# Patient Record
Sex: Female | Born: 1955 | Race: White | Hispanic: No | Marital: Married | Smoking: Former smoker
Health system: Southern US, Community
[De-identification: ages and names within clinical notes are randomized; demographics above are authoritative.]

## PROBLEM LIST (undated history)

## (undated) DIAGNOSIS — E039 Hypothyroidism, unspecified: Secondary | ICD-10-CM

## (undated) DIAGNOSIS — E785 Hyperlipidemia, unspecified: Secondary | ICD-10-CM

## (undated) DIAGNOSIS — R9409 Abnormal results of other function studies of central nervous system: Secondary | ICD-10-CM

## (undated) DIAGNOSIS — I10 Essential (primary) hypertension: Secondary | ICD-10-CM

## (undated) HISTORY — DX: Essential (primary) hypertension: I10

## (undated) HISTORY — DX: Abnormal results of other function studies of central nervous system: R94.09

## (undated) HISTORY — DX: Hyperlipidemia, unspecified: E78.5

## (undated) HISTORY — DX: Hypothyroidism, unspecified: E03.9

---

## 2000-01-04 ENCOUNTER — Encounter: Admission: RE | Admit: 2000-01-04 | Discharge: 2000-01-04 | Payer: Self-pay | Admitting: Family Medicine

## 2000-01-04 ENCOUNTER — Encounter: Payer: Self-pay | Admitting: Family Medicine

## 2000-02-16 ENCOUNTER — Encounter: Payer: Self-pay | Admitting: Internal Medicine

## 2000-02-16 ENCOUNTER — Ambulatory Visit (HOSPITAL_COMMUNITY): Admission: RE | Admit: 2000-02-16 | Discharge: 2000-02-16 | Payer: Self-pay | Admitting: Internal Medicine

## 2000-04-01 ENCOUNTER — Observation Stay (HOSPITAL_COMMUNITY): Admission: EM | Admit: 2000-04-01 | Discharge: 2000-04-02 | Payer: Self-pay | Admitting: Emergency Medicine

## 2000-04-10 HISTORY — PX: THYROIDECTOMY: SHX17

## 2000-08-01 ENCOUNTER — Encounter: Payer: Self-pay | Admitting: Surgery

## 2000-08-06 ENCOUNTER — Encounter (INDEPENDENT_AMBULATORY_CARE_PROVIDER_SITE_OTHER): Payer: Self-pay

## 2000-08-06 ENCOUNTER — Observation Stay (HOSPITAL_COMMUNITY): Admission: RE | Admit: 2000-08-06 | Discharge: 2000-08-07 | Payer: Self-pay | Admitting: Surgery

## 2001-04-16 ENCOUNTER — Other Ambulatory Visit: Admission: RE | Admit: 2001-04-16 | Discharge: 2001-04-16 | Payer: Self-pay | Admitting: Gynecology

## 2001-08-04 ENCOUNTER — Emergency Department (HOSPITAL_COMMUNITY): Admission: EM | Admit: 2001-08-04 | Discharge: 2001-08-04 | Payer: Self-pay | Admitting: Emergency Medicine

## 2002-06-09 ENCOUNTER — Other Ambulatory Visit: Admission: RE | Admit: 2002-06-09 | Discharge: 2002-06-09 | Payer: Self-pay | Admitting: Gynecology

## 2003-07-13 ENCOUNTER — Other Ambulatory Visit: Admission: RE | Admit: 2003-07-13 | Discharge: 2003-07-13 | Payer: Self-pay | Admitting: Gynecology

## 2003-07-30 ENCOUNTER — Encounter: Admission: RE | Admit: 2003-07-30 | Discharge: 2003-07-30 | Payer: Self-pay | Admitting: Gynecology

## 2003-08-06 ENCOUNTER — Encounter: Admission: RE | Admit: 2003-08-06 | Discharge: 2003-08-06 | Payer: Self-pay | Admitting: Gynecology

## 2004-02-11 ENCOUNTER — Encounter: Admission: RE | Admit: 2004-02-11 | Discharge: 2004-02-11 | Payer: Self-pay | Admitting: Gynecology

## 2004-02-25 ENCOUNTER — Other Ambulatory Visit: Admission: RE | Admit: 2004-02-25 | Discharge: 2004-02-25 | Payer: Self-pay | Admitting: Gynecology

## 2004-04-01 ENCOUNTER — Ambulatory Visit (HOSPITAL_COMMUNITY): Admission: RE | Admit: 2004-04-01 | Discharge: 2004-04-01 | Payer: Self-pay | Admitting: Internal Medicine

## 2004-04-14 ENCOUNTER — Ambulatory Visit (HOSPITAL_BASED_OUTPATIENT_CLINIC_OR_DEPARTMENT_OTHER): Admission: RE | Admit: 2004-04-14 | Discharge: 2004-04-14 | Payer: Self-pay | Admitting: Internal Medicine

## 2004-04-14 ENCOUNTER — Ambulatory Visit: Payer: Self-pay | Admitting: Internal Medicine

## 2004-05-04 ENCOUNTER — Ambulatory Visit: Payer: Self-pay | Admitting: Internal Medicine

## 2004-10-18 ENCOUNTER — Encounter: Admission: RE | Admit: 2004-10-18 | Discharge: 2004-10-18 | Payer: Self-pay | Admitting: Gynecology

## 2004-10-19 ENCOUNTER — Other Ambulatory Visit: Admission: RE | Admit: 2004-10-19 | Discharge: 2004-10-19 | Payer: Self-pay | Admitting: Gynecology

## 2006-01-10 ENCOUNTER — Encounter: Admission: RE | Admit: 2006-01-10 | Discharge: 2006-01-10 | Payer: Self-pay | Admitting: Family Medicine

## 2006-01-30 ENCOUNTER — Other Ambulatory Visit: Admission: RE | Admit: 2006-01-30 | Discharge: 2006-01-30 | Payer: Self-pay | Admitting: Gynecology

## 2006-04-10 LAB — CONVERTED CEMR LAB: Pap Smear: NORMAL

## 2006-10-16 ENCOUNTER — Other Ambulatory Visit: Admission: RE | Admit: 2006-10-16 | Discharge: 2006-10-16 | Payer: Self-pay | Admitting: Gynecology

## 2008-04-01 ENCOUNTER — Encounter: Payer: Self-pay | Admitting: Family Medicine

## 2008-04-17 ENCOUNTER — Encounter: Payer: Self-pay | Admitting: Family Medicine

## 2008-04-17 ENCOUNTER — Encounter: Admission: RE | Admit: 2008-04-17 | Discharge: 2008-04-17 | Payer: Self-pay | Admitting: Family Medicine

## 2008-09-14 ENCOUNTER — Ambulatory Visit: Payer: Self-pay | Admitting: Family Medicine

## 2008-09-14 DIAGNOSIS — E039 Hypothyroidism, unspecified: Secondary | ICD-10-CM

## 2008-09-14 DIAGNOSIS — E785 Hyperlipidemia, unspecified: Secondary | ICD-10-CM | POA: Insufficient documentation

## 2008-09-14 HISTORY — DX: Hypothyroidism, unspecified: E03.9

## 2008-09-14 HISTORY — DX: Hyperlipidemia, unspecified: E78.5

## 2008-09-15 LAB — CONVERTED CEMR LAB
ALT: 12 units/L (ref 0–35)
Albumin: 3.8 g/dL (ref 3.5–5.2)
Basophils Absolute: 0 10*3/uL (ref 0.0–0.1)
Cholesterol: 234 mg/dL — ABNORMAL HIGH (ref 0–200)
Creatinine, Ser: 0.8 mg/dL (ref 0.4–1.2)
Direct LDL: 178 mg/dL
Eosinophils Relative: 2.6 % (ref 0.0–5.0)
HCT: 43.6 % (ref 36.0–46.0)
Hemoglobin: 15.2 g/dL — ABNORMAL HIGH (ref 12.0–15.0)
MCHC: 34.8 g/dL (ref 30.0–36.0)
MCV: 92 fL (ref 78.0–100.0)
Neutro Abs: 3.6 10*3/uL (ref 1.4–7.7)
Neutrophils Relative %: 59.5 % (ref 43.0–77.0)
Platelets: 163 10*3/uL (ref 150.0–400.0)
RBC: 4.74 M/uL (ref 3.87–5.11)
RDW: 13.1 % (ref 11.5–14.6)
TSH: 1.34 microintl units/mL (ref 0.35–5.50)
Total Bilirubin: 0.6 mg/dL (ref 0.3–1.2)
Total CHOL/HDL Ratio: 6
Total Protein: 7 g/dL (ref 6.0–8.3)
Triglycerides: 128 mg/dL (ref 0.0–149.0)
VLDL: 25.6 mg/dL (ref 0.0–40.0)

## 2008-10-30 ENCOUNTER — Telehealth: Payer: Self-pay | Admitting: Family Medicine

## 2008-11-13 ENCOUNTER — Ambulatory Visit: Payer: Self-pay | Admitting: Family Medicine

## 2008-11-13 LAB — CONVERTED CEMR LAB
Albumin: 3.8 g/dL (ref 3.5–5.2)
Alkaline Phosphatase: 86 units/L (ref 39–117)
Bilirubin, Direct: 0.1 mg/dL (ref 0.0–0.3)
HDL: 38.3 mg/dL — ABNORMAL LOW (ref >39.00)
Total CHOL/HDL Ratio: 4
Triglycerides: 72 mg/dL (ref 0.0–149.0)

## 2009-05-14 ENCOUNTER — Ambulatory Visit: Payer: Self-pay | Admitting: Family Medicine

## 2009-05-17 LAB — CONVERTED CEMR LAB
LDL Cholesterol: 78 mg/dL (ref 0–99)
TSH: 2.18 microintl units/mL (ref 0.35–5.50)
Total CHOL/HDL Ratio: 3
Triglycerides: 77 mg/dL (ref 0.0–149.0)

## 2009-07-05 ENCOUNTER — Ambulatory Visit: Payer: Self-pay | Admitting: Family Medicine

## 2009-07-05 DIAGNOSIS — R42 Dizziness and giddiness: Secondary | ICD-10-CM | POA: Insufficient documentation

## 2009-07-05 DIAGNOSIS — M545 Low back pain, unspecified: Secondary | ICD-10-CM | POA: Insufficient documentation

## 2009-07-05 LAB — CONVERTED CEMR LAB: Blood Glucose, Fingerstick: 101

## 2009-07-14 ENCOUNTER — Ambulatory Visit: Payer: Self-pay | Admitting: Family Medicine

## 2009-08-11 ENCOUNTER — Ambulatory Visit: Payer: Self-pay | Admitting: Family Medicine

## 2009-08-11 DIAGNOSIS — I10 Essential (primary) hypertension: Secondary | ICD-10-CM | POA: Insufficient documentation

## 2009-08-11 HISTORY — DX: Essential (primary) hypertension: I10

## 2009-08-11 LAB — CONVERTED CEMR LAB
BUN: 13 mg/dL (ref 6–23)
Creatinine, Ser: 0.8 mg/dL (ref 0.4–1.2)

## 2009-08-13 ENCOUNTER — Encounter: Admission: RE | Admit: 2009-08-13 | Discharge: 2009-08-13 | Payer: Self-pay | Admitting: Family Medicine

## 2009-08-16 DIAGNOSIS — R9409 Abnormal results of other function studies of central nervous system: Secondary | ICD-10-CM

## 2009-08-16 HISTORY — DX: Abnormal results of other function studies of central nervous system: R94.09

## 2009-08-27 ENCOUNTER — Telehealth: Payer: Self-pay | Admitting: Family Medicine

## 2009-08-27 ENCOUNTER — Ambulatory Visit: Payer: Self-pay | Admitting: Family Medicine

## 2009-08-30 ENCOUNTER — Ambulatory Visit: Payer: Self-pay | Admitting: Family Medicine

## 2009-09-15 ENCOUNTER — Telehealth: Payer: Self-pay | Admitting: Family Medicine

## 2009-09-17 ENCOUNTER — Telehealth: Payer: Self-pay | Admitting: Family Medicine

## 2009-10-13 ENCOUNTER — Telehealth (INDEPENDENT_AMBULATORY_CARE_PROVIDER_SITE_OTHER): Payer: Self-pay | Admitting: *Deleted

## 2009-10-27 ENCOUNTER — Telehealth (INDEPENDENT_AMBULATORY_CARE_PROVIDER_SITE_OTHER): Payer: Self-pay | Admitting: *Deleted

## 2009-10-29 ENCOUNTER — Telehealth: Payer: Self-pay | Admitting: Family Medicine

## 2009-11-16 ENCOUNTER — Encounter (INDEPENDENT_AMBULATORY_CARE_PROVIDER_SITE_OTHER): Payer: Self-pay | Admitting: Diagnostic Neuroimaging

## 2009-11-16 ENCOUNTER — Ambulatory Visit: Payer: Self-pay | Admitting: Cardiology

## 2009-11-16 ENCOUNTER — Ambulatory Visit (HOSPITAL_COMMUNITY)
Admission: RE | Admit: 2009-11-16 | Discharge: 2009-11-16 | Payer: Self-pay | Source: Home / Self Care | Admitting: Diagnostic Neuroimaging

## 2009-11-16 ENCOUNTER — Ambulatory Visit: Payer: Self-pay

## 2009-11-23 ENCOUNTER — Telehealth: Payer: Self-pay | Admitting: Cardiovascular Disease

## 2009-11-25 ENCOUNTER — Telehealth: Payer: Self-pay | Admitting: Family Medicine

## 2009-12-20 ENCOUNTER — Encounter: Payer: Self-pay | Admitting: Family Medicine

## 2009-12-21 ENCOUNTER — Telehealth: Payer: Self-pay | Admitting: Family Medicine

## 2009-12-29 ENCOUNTER — Encounter: Payer: Self-pay | Admitting: Family Medicine

## 2010-01-02 ENCOUNTER — Encounter: Payer: Self-pay | Admitting: Family Medicine

## 2010-01-20 ENCOUNTER — Ambulatory Visit: Payer: Self-pay | Admitting: Family Medicine

## 2010-01-20 DIAGNOSIS — J309 Allergic rhinitis, unspecified: Secondary | ICD-10-CM | POA: Insufficient documentation

## 2010-03-11 ENCOUNTER — Encounter: Payer: Self-pay | Admitting: Family Medicine

## 2010-03-28 ENCOUNTER — Ambulatory Visit
Admission: RE | Admit: 2010-03-28 | Discharge: 2010-03-28 | Payer: Self-pay | Source: Home / Self Care | Attending: Urology | Admitting: Urology

## 2010-03-30 ENCOUNTER — Encounter: Payer: Self-pay | Admitting: Family Medicine

## 2010-05-01 ENCOUNTER — Encounter: Payer: Self-pay | Admitting: Family Medicine

## 2010-05-10 NOTE — Assessment & Plan Note (Signed)
Summary: dizziness//ccc   Vital Signs:  Patient profile:   55 year old female Menstrual status:  postmenopausal Weight:      154 pounds BMI:     25.33 Temp:     98.5 degrees F oral Pulse rate:   56 / minute Pulse rhythm:   regular BP sitting:   118 / 66  (left arm) Cuff size:   regular  Vitals Entered By: Raechel Ache, RN (Aug 27, 2009 4:20 PM) CC: Referred to neurologist by Dr Loriana Kitchens, seeing June 1st. Sx getting worse- sweaty, dizzy, nauseous and nearly passing out x3 today.   History of Present Illness: Here to discuss spells of dizziness and near syncope that have been going on for several months. She has seen Dr. Caryl Never for this, and her workup has been notable for an abnormal brain MRI on 08-13-09. This showed widespread white matter lesions which are suggestive of a demyelinating process. the largest of these areas is locate din the left cerebellum. Her main symptoms are dizziness which make her feel like the room is moving around her and a feeling of lightheadedness like she might pass out. There has been no LOC however. She has some intermittent diplopia as well. No HA or changes in sensation or strength. She has stopped driving for now, and she has her son driving her around. She has an appt. for a neurology consult with Dr. Joycelyn Schmid on 09-08-09.   Allergies (verified): No Known Drug Allergies  Past History:  Past Medical History: Reviewed history from 07/05/2009 and no changes required. Arthritis Hypertension Hyperlipidemia Kidney stones Hypothyroid  Past Surgical History: Reviewed history from 09/14/2008 and no changes required. Thyroidectomy 2002  Review of Systems  The patient denies anorexia, fever, weight loss, weight gain, vision loss, decreased hearing, hoarseness, chest pain, syncope, dyspnea on exertion, peripheral edema, prolonged cough, headaches, hemoptysis, abdominal pain, melena, hematochezia, severe indigestion/heartburn, hematuria,  incontinence, genital sores, muscle weakness, suspicious skin lesions, transient blindness, difficulty walking, depression, unusual weight change, abnormal bleeding, enlarged lymph nodes, angioedema, breast masses, and testicular masses.    Physical Exam  General:  Well-developed,well-nourished,in no acute distress; alert,appropriate and cooperative throughout examination Head:  Normocephalic and atraumatic without obvious abnormalities. No apparent alopecia or balding. Eyes:  No corneal or conjunctival inflammation noted. EOMI. Perrla. Funduscopic exam benign, without hemorrhages, exudates or papilledema. Vision grossly normal. Neck:  No deformities, masses, or tenderness noted. Lungs:  Normal respiratory effort, chest expands symmetrically. Lungs are clear to auscultation, no crackles or wheezes. Heart:  Normal rate and regular rhythm. S1 and S2 normal without gallop, murmur, click, rub or other extra sounds. Neurologic:  alert & oriented X3, cranial nerves II-XII intact, and gait normal.     Impression & Recommendations:  Problem # 1:  DIZZINESS (ICD-780.4)  Her updated medication list for this problem includes:    Meclizine Hcl 25 Mg Tabs (Meclizine hcl) .Marland Kitchen... 1 q 4 hours as needed for dizziness  Problem # 2:  MRI, BRAIN, ABNORMAL (ICD-794.09)  Complete Medication List: 1)  Levothroid 125 Mcg Tabs (Levothyroxine sodium) .... Once daily 2)  Crestor 10 Mg Tabs (Rosuvastatin calcium) .... Once daily 3)  Benicar Hct 20-12.5 Mg Tabs (Olmesartan medoxomil-hctz) .... One by mouth once daily 4)  Meclizine Hcl 25 Mg Tabs (Meclizine hcl) .Marland Kitchen.. 1 q 4 hours as needed for dizziness  Patient Instructions: 1)  This picture is certainly worrisome for a demyelinating process, and it is no coincidence that her main symptoms involve dysequilibrium given  that the largest  lesion seen on her MRI is in the cerebellum. try Meclizine to help with the symptoms. rest, stay out of the heat, see Dr. Caryl Never  next week.  Prescriptions: MECLIZINE HCL 25 MG TABS (MECLIZINE HCL) 1 q 4 hours as needed for dizziness  #60 x 2   Entered and Authorized by:   Nelwyn Salisbury MD   Signed by:   Nelwyn Salisbury MD on 08/27/2009   Method used:   Electronically to        [facility] [address] [phone]* (retail)       [address] [place], [place]  [address]       Ph: [phone]       Fax: [phone]   RxID:   [id number]

## 2010-05-10 NOTE — Progress Notes (Signed)
Summary: rx losartan  Phone Note Call from Patient   Caller: Patient Call For: Evelena Peat MD Summary of Call: called wed r/t change for benicar - too exspensvie.   In looking into chart - Dr. Caryl Never did handle 7/20 - must not of been called in . Will handle now. KIK Initial call taken by: Duard Brady LPN,  October 29, 2009 1:37 PM    New/Updated Medications: LOSARTAN POTASSIUM 50 MG TABS (LOSARTAN POTASSIUM) qd Prescriptions: LOSARTAN POTASSIUM 50 MG TABS (LOSARTAN POTASSIUM) qd  #30 x 5   Entered by:   Duard Brady LPN   Authorized by:   Evelena Peat MD   Signed by:   Duard Brady LPN on 16/01/9603   Method used:   Faxed to ...       [facility] [address] (retail)       [address] [place], [place]  [address]       Ph: [phone]       Fax: [phone]   RxID:   [id number]

## 2010-05-10 NOTE — Progress Notes (Signed)
Summary: Pt req referral to either [facility] or [facility]  Phone Note Call from Patient Call back at [phone] cell   Caller: Patient Summary of Call: Pt called and was referred to [facility], and pt said that she had a bad experience there. Pt is req to be referred over to [place], [place] or [place]. Pls call.     Initial call taken by: Lucy Antigua,  November 25, 2009 9:37 AM  Follow-up for Phone Call        I'm OK to refer to any place of her preference.  Other option is [facility]-I've gotten good feedback from pts who have been seen there.  Is she OK with considering that? Follow-up by: Evelena Peat MD,  November 25, 2009 1:08 PM  Additional Follow-up for Phone Call Additional follow up Details #1::        I called pt and told her about [facility]. Pt said that [facility] would be fine with her, as long as Dr. Mar Daring feels that is a good place to go. Pt says that when this appt is scheduled, it problably need to be for mid morning, between 9:30am and 12:00pm.     Additional Follow-up by: Lucy Antigua,  November 26, 2009 9:16 AM    Additional Follow-up for Phone Call Additional follow up Details #2::    will set up appt with [facility]. Follow-up by: Evelena Peat MD,  November 26, 2009 1:11 PM

## 2010-05-10 NOTE — Assessment & Plan Note (Signed)
Summary: 1 month rov/njr   Vital Signs:  Patient profile:   55 year old female Menstrual status:  postmenopausal Temp:     97.9 degrees F oral BP sitting:   140 / 84  (left arm) Cuff size:   regular  Vitals Entered By: [name] LPN (Aug 11, 452 8:19 AM)  Serial Vital Signs/Assessments:  Time      Position  BP       Pulse  Resp  Temp     By                     162/82                         Evelena Peat MD  CC: one month follow-up, Hypertension Management   History of Present Illness: Follow up hypertension.  Started Benicar and tolerating well.  BP still high by home readings 150s to 160 systolic and diastolics 80s.  Recent issues of dizziness, decreased appetite, nausea with occ vomiting, and binocular diplopia.  had these episodes occur somewhat infrequently and symptoms seem to be very episodic. She has noted that she seems to have more frequent symptoms of dizziness and diplopia with driving. Denies any true blurred vision or loss of vision. She's also having some unilateral headaches which are new in the right parietal occipital region. These too are somewhat intermittent. Her other symptoms of diplopia ,nausea ,and dizziness seemed to be correlating with headaches off and on. She denies any focal weakness.  Dizziness sounds to be more of a vertigo at times. No orthostatic type symptoms.  patient has hypothyroidism with normal TSH in February of this year.  Hypertension History:      She denies chest pain, palpitations, dyspnea with exertion, orthopnea, PND, peripheral edema, syncope, and side effects from treatment.        Positive major cardiovascular risk factors include hyperlipidemia and hypertension.  Negative major cardiovascular risk factors include female age less than 70 years old and non-tobacco-user status.     Allergies (verified): No Known Drug Allergies  Past History:  Past Medical History: Last updated:  07/05/2009 Arthritis Hypertension Hyperlipidemia Kidney stones Hypothyroid  Past Surgical History: Last updated: 09/14/2008 Thyroidectomy 2002  Social History: Last updated: 07/05/2009 Occupation: Married Former Smoker  Review of Systems       The patient complains of anorexia.  The patient denies fever, weight loss, vision loss, decreased hearing, chest pain, syncope, dyspnea on exertion, peripheral edema, prolonged cough, hemoptysis, abdominal pain, melena, hematochezia, severe indigestion/heartburn, muscle weakness, transient blindness, and difficulty walking.    Physical Exam  General:  Well-developed,well-nourished,in no acute distress; alert,appropriate and cooperative throughout examination Head:  Normocephalic and atraumatic without obvious abnormalities. No apparent alopecia or balding. Eyes:  No corneal or conjunctival inflammation noted. EOMI. Perrla. Funduscopic exam benign, without hemorrhages, exudates or papilledema. Vision grossly normal. Ears:  External ear exam shows no significant lesions or deformities.  Otoscopic examination reveals clear canals, tympanic membranes are intact bilaterally without bulging, retraction, inflammation or discharge. Hearing is grossly normal bilaterally. Mouth:  Oral mucosa and oropharynx without lesions or exudates.  Teeth in good repair. Neck:  No deformities, masses, or tenderness noted. Lungs:  Normal respiratory effort, chest expands symmetrically. Lungs are clear to auscultation, no crackles or wheezes. Heart:  Normal rate and regular rhythm. S1 and S2 normal without gallop, murmur, click, rub or other extra sounds. Neurologic:  alert & oriented X3,  cranial nerves II-XII intact, strength normal in all extremities, gait normal, DTRs symmetrical and normal, and finger-to-nose normal.     Impression & Recommendations:  Problem # 1:  ESSENTIAL HYPERTENSION (ICD-401.9) slightly improved but not to goal. Change medication to Benicar  HCTZ and reassess one month Her updated medication list for this problem includes:    Benicar Hct 20-12.5 Mg Tabs (Olmesartan medoxomil-hctz) ..... One by mouth once daily  Problem # 2:  DIZZINESS (ICD-780.4)  patient had some progressive symptoms of dizziness and symptom complex including decreased appetite, intermittent atypical headaches and some binocular diplopia. She needs MRI scan of brain to further assess and we'll set up  Orders: Radiology Referral (Radiology)  Complete Medication List: 1)  Levothroid 125 Mcg Tabs (Levothyroxine sodium) .... Once daily 2)  Crestor 10 Mg Tabs (Rosuvastatin calcium) .... Once daily 3)  Benicar Hct 20-12.5 Mg Tabs (Olmesartan medoxomil-hctz) .... One by mouth once daily  Hypertension Assessment/Plan:      The patient's hypertensive risk group is category B: At least one risk factor (excluding diabetes) with no target organ damage.  Her calculated 10 year risk of coronary heart disease is 8 %.  Today's blood pressure is 140/84.    Patient Instructions: 1)  Please schedule a follow-up appointment in 1 month.  2)  we'll call you regarding MRI appointment

## 2010-05-10 NOTE — Progress Notes (Signed)
Summary: has ? for dr Patrisha Hausmann  Phone Note Call from Patient Call back at [phone]   Caller: Patient---live call Summary of Call: Going through some tests and has ? about med. Wants Dr Caryl Never to return call. Initial call taken by: Warnell Forester,  September 15, 2009 2:43 PM  Follow-up for Phone Call        Seeing neurologist for work up abnormal MRI.  Dx  still in question. Symptoms have improved since stopping HCTZ.   Tolerating plain Benicar.  She is encouraged to continue with her neurologic w/u. Follow-up by: Evelena Peat MD,  September 15, 2009 6:23 PM

## 2010-05-10 NOTE — Progress Notes (Signed)
Summary: refill plain benicar  Phone Note Call from Patient Call back at [phone]   Caller: vm Summary of Call: Benicar 20mg  samples almost gone.  Says the HCT makes her have dizzy spells & near fainting.  Needs the plain Benicar.   Rx needed to [facility] [address] Initial call taken by: Rudy Jew, RN,  October 13, 2009 4:33 PM  Follow-up for Phone Call        refill Benicar 20 mg one half tablet by mouth once daily #30 with 5 refills. I have sent.  Let patient know OK to pick up. Follow-up by: Evelena Peat MD,  October 13, 2009 5:42 PM    New/Updated Medications: BENICAR 20 MG TABS (OLMESARTAN MEDOXOMIL) one half by mouth once daily Prescriptions: BENICAR 20 MG TABS (OLMESARTAN MEDOXOMIL) one half by mouth once daily  #30 x 5   Entered and Authorized by:   Evelena Peat MD   Signed by:   Evelena Peat MD on 10/13/2009   Method used:   Electronically to        [facility] [address] [phone]* (retail)       [address] [place], [place]  [address]       Ph: [phone]       Fax: [phone]   RxID:   [id number]

## 2010-05-10 NOTE — Consult Note (Signed)
Summary: [facility] @ [facility]   Imported By: Maryln Gottron 12/28/2009 13:56:33  _____________________________________________________________________  External Attachment:    Type:   Image     Comment:   External Document

## 2010-05-10 NOTE — Assessment & Plan Note (Signed)
Summary: BP RECHECK / F/U //RS   Vital Signs:  Patient profile:   55 year old female Menstrual status:  postmenopausal Temp:     97.8 degrees F oral BP sitting:   160 / 88  (left arm) Cuff size:   regular  Vitals Entered By: [name] LPN (July 14, 4780 8:33 AM) CC: BP check   History of Present Illness: Followup hypertension.  By home readings she's had consistent elevations between 160s to 190 systolic. No significant headaches since last visit. No further episodes of diplopia. Previously treated with some sort of blood pressure medication years ago but took briefly and apparently blood pressures improved and she was subsequently taken off. Denies any chest pains.  Allergies: No Known Drug Allergies  Past History:  Past Medical History: Last updated: 07/05/2009 Arthritis Hypertension Hyperlipidemia Kidney stones Hypothyroid PMH reviewed for relevance  Review of Systems  The patient denies anorexia, weight loss, vision loss, chest pain, syncope, dyspnea on exertion, and peripheral edema.    Physical Exam  General:  Well-developed,well-nourished,in no acute distress; alert,appropriate and cooperative throughout examination Neck:  scar from previous thyroidectomy. No mass Lungs:  Normal respiratory effort, chest expands symmetrically. Lungs are clear to auscultation, no crackles or wheezes. Heart:  Normal rate and regular rhythm. S1 and S2 normal without gallop, murmur, click, rub or other extra sounds.   Impression & Recommendations:  Problem # 1:  INCREASED BLOOD PRESSURE (ICD-796.2)  At this point we elected to treat. Start Benicar 20 mg daily and reassess one month- samples given  Her updated medication list for this problem includes:    Benicar 20 Mg Tabs (Olmesartan medoxomil) ..... One by mouth once daily  Complete Medication List: 1)  Levothroid 125 Mcg Tabs (Levothyroxine sodium) .... Once daily 2)  Crestor 10 Mg Tabs (Rosuvastatin calcium) ....  Once daily 3)  Benicar 20 Mg Tabs (Olmesartan medoxomil) .... One by mouth once daily  Patient Instructions: 1)  Please schedule a follow-up appointment in 1 month.  2)  It is important that you exercise reguarly at least 20 minutes 5 times a week. If you develop chest pain, have severe difficulty breathing, or feel very tired, stop exercising immediately and seek medical attention.  Prescriptions: CRESTOR 10 MG TABS (ROSUVASTATIN CALCIUM) once daily  #30.0 Each x 11   Entered and Authorized by:   Evelena Peat MD   Signed by:   Evelena Peat MD on 07/14/2009   Method used:   Electronically to        [facility] [address] [phone]* (retail)       [address] [place], [place]  [address]       Ph: [phone]       Fax: [phone]   RxID:   [id number]

## 2010-05-10 NOTE — Assessment & Plan Note (Signed)
Summary: Consultation re: neurology referral/nn   Vital Signs:  Patient profile:   55 year old female Menstrual status:  postmenopausal Temp:     98.2 degrees F oral BP sitting:   112 / 72  (left arm) Cuff size:   regular  Vitals Entered By: Duard Brady LPN (January 20, 2010 11:15 AM) CC: f/u neurology consultation , alsoc c/o head and chest congestion OTC not helping Is Patient Diabetic? No   History of Present Illness: Patient seen for the following.  Patient presented with neurologic symptoms including diplopia, headache, and dizziness last spring and we sent for MRI scan. This came back with nonspecific abnormalities. She was sent to two diffenrent neurology groups and has been unhappy with care in both. More recently had difficult lumbar puncture. Reportedly this end up being okay. She had several labs including 24-hour urine reportedly to evaluate mildly elevated serum protein. She has not heard back from lab work and this was several weeks ago. She requested those labs be forwarded here. At this point she generally feels well no headache, dizziness or focal neurologic symptoms. Her diplopia has fully cleared.  Part of her frustration is that she has been told multiple things (per pt ) from different neurologists regarding possible etioilogy.  One stated probable MS and another was very concerned about possible stroke.  She has acute issue today of 2-3  day history nasal congestion, frequent sneezing, clear nasal discharge and occasional cough. No fever. Has not taken any antihistamines.  Allergies (verified): 1)  ! * Benicar  Past History:  Past Medical History: Last updated: 07/05/2009 Arthritis Hypertension Hyperlipidemia Kidney stones Hypothyroid  Past Surgical History: Last updated: 09/14/2008 Thyroidectomy 2002  Family History: Last updated: 09/14/2008 Family History of Arthritis Family history heart disease due to malaria, parent family history  prostate cancer, grandfather family history kidney disease, blood relative family historu diabetes, blood relative  Social History: Last updated: 07/05/2009 Occupation: Married Former Smoker  Risk Factors: Smoking Status: quit (07/05/2009) PMH-FH-SH reviewed for relevance  Review of Systems  The patient denies anorexia, fever, weight loss, vision loss, decreased hearing, chest pain, dyspnea on exertion, headaches, abdominal pain, incontinence, muscle weakness, and suspicious skin lesions.    Physical Exam  General:  Well-developed,well-nourished,in no acute distress; alert,appropriate and cooperative throughout examination Head:  Normocephalic and atraumatic without obvious abnormalities. No apparent alopecia or balding. Ears:  External ear exam shows no significant lesions or deformities.  Otoscopic examination reveals clear canals, tympanic membranes are intact bilaterally without bulging, retraction, inflammation or discharge. Hearing is grossly normal bilaterally. Nose:  External nasal examination shows no deformity or inflammation. Nasal mucosa are pink and moist without lesions or exudates. Mouth:  Oral mucosa and oropharynx without lesions or exudates.  Teeth in good repair. Neck:  No deformities, masses, or tenderness noted. Lungs:  Normal respiratory effort, chest expands symmetrically. Lungs are clear to auscultation, no crackles or wheezes. Heart:  Normal rate and regular rhythm. S1 and S2 normal without gallop, murmur, click, rub or other extra sounds. Neurologic:  alert & oriented X3, cranial nerves II-XII intact, and gait normal.     Impression & Recommendations:  Problem # 1:  MRI, BRAIN, ABNORMAL (ICD-794.09) She has had extensive work up.  She has requested we receive and review her recent labs and I have not received yet.  Problem # 2:  ALLERGIC RHINITIS (ICD-477.9) Assessment: New try OTC antihistamine.  Problem # 3:  Preventive Health Care (ICD-V70.0) flu  vaccine given.  Complete  Medication List: 1)  Levothroid 125 Mcg Tabs (Levothyroxine sodium) .... Once daily 2)  Crestor 10 Mg Tabs (Rosuvastatin calcium) .... Once daily 3)  Losartan Potassium 50 Mg Tabs (Losartan potassium) .... Qd  Other Orders: Admin 1st Vaccine (04540) Flu Vaccine 77yrs + (98119)  Patient Instructions: 1)  We do not have copy of recent labs from Neurologist yet.  Will review when available. 2)  Try Allegra 180 mg one daily.   Flu Vaccine Consent Questions     Do you have a history of severe allergic reactions to this vaccine? no    Any prior history of allergic reactions to egg and/or gelatin? no    Do you have a sensitivity to the preservative Thimersol? no    Do you have a past history of Guillan-Barre Syndrome? no    Do you currently have an acute febrile illness? no    Have you ever had a severe reaction to latex? no    Vaccine information given and explained to patient? yes    Are you currently pregnant? no    Lot Number:[id number]   Exp Date:10/08/2010   Site Given  Left Deltoid IMbflu

## 2010-05-10 NOTE — Letter (Signed)
Summary: [facility]  [facility]   Imported By: Maryln Gottron 01/26/2010 11:08:03  _____________________________________________________________________  External Attachment:    Type:   Image     Comment:   External Document

## 2010-05-10 NOTE — Letter (Signed)
Summary: [facility]  [facility]   Imported By: Maryln Gottron 03/17/2010 12:38:57  _____________________________________________________________________  External Attachment:    Type:   Image     Comment:   External Document

## 2010-05-10 NOTE — Progress Notes (Signed)
Summary: FYI  Phone Note Call from Patient   Caller: Patient Call For: Evelena Peat MD Summary of Call: Pt is calling to report that she is having "near syncope" episodes, and BP is 100/60.  Offered work in appt today or appt in Saturday clinic, but she declined.  Appt scheduled for Monday, and instructed pt to call back or go to the ER if symptoms change or get worse.  No chest pain, bleeding or obvious reason for syncope from her complaints.  She informed me that she is being evaluated for MS, and has multiple neurological symptoms. Initial call taken by: Lynann Beaver CMA,  Aug 27, 2009 1:40 PM  Follow-up for Phone Call        she was seen today Follow-up by: Nelwyn Salisbury MD,  Aug 27, 2009 5:10 PM

## 2010-05-10 NOTE — Progress Notes (Signed)
Summary: different med for BP  Phone Note Call from Patient Call back at [phone]   Caller: vm Summary of Call: Copay med for BP $150 for 30 day supply. Did not get it.  Need different med.  Pharmacist said there are plenty of others that don't cost that much.   Initial call taken by: Rudy Jew, RN,  October 27, 2009 4:52 PM  Follow-up for Phone Call        change to Losartan 50 mg one by mouth once daily   disp #30 with 5 refills. Follow-up by: Evelena Peat MD,  October 27, 2009 5:49 PM  Additional Follow-up for Phone Call Additional follow up Details #1::        handled 10/29/09   kik Additional Follow-up by: Duard Brady LPN,  October 29, 2009 3:10 PM

## 2010-05-10 NOTE — Op Note (Signed)
Summary: Lumbar Puncture/[facility]  Lumbar Puncture/[facility]   Imported By: Maryln Gottron 01/26/2010 11:06:51  _____________________________________________________________________  External Attachment:    Type:   Image     Comment:   External Document

## 2010-05-10 NOTE — Assessment & Plan Note (Signed)
Summary: concerned about BP/dm   Vital Signs:  Patient profile:   55 year old female Menstrual status:  postmenopausal Temp:     98.2 degrees F oral BP sitting:   120 / 72  (left arm) BP standing:   98 / 60 Cuff size:   regular  Vitals Entered By: [name] LPN (Aug 30, 2009 9:57 AM)  Serial Vital Signs/Assessments:  Time      Position  BP       Pulse  Resp  Temp     By           Lying LA  102/60                         Evelena Peat MD           Standing  98/60                          Evelena Peat MD  CC: Ongoing dizzines, nausea   History of Present Illness: Patient seen for followup regarding ongoing dizziness and intermittent nausea. Refer to previous note. Recent MRI scan revealed question of demyelinating process in the cerebellum. Patient continues to have intermittent symptoms of diplopia and dizziness. Blood pressures on the low side past few days on Benicar-HCTZ. She additionally has some occasional orthostatic type symptoms now. Poor appetite over the past week. Otherwise no focal neurologic symptoms. Currently on Benicar HCTZ 20/12.5  She thinks the meclizine prescribed last week has worsened her dizziness, though has helped the nausea somewhat. Appointment with neurologist in one week  Allergies (verified): No Known Drug Allergies  Past History:  Past Medical History: Last updated: 07/05/2009 Arthritis Hypertension Hyperlipidemia Kidney stones Hypothyroid  Past Surgical History: Last updated: 09/14/2008 Thyroidectomy 2002  Family History: Last updated: 09/14/2008 Family History of Arthritis Family history heart disease due to malaria, parent family history prostate cancer, grandfather family history kidney disease, blood relative family historu diabetes, blood relative  Social History: Last updated: 07/05/2009 Occupation: Married Former Smoker  Risk Factors: Smoking Status: quit (07/05/2009)  Review of Systems       The patient  complains of anorexia.  The patient denies fever, vision loss, decreased hearing, chest pain, syncope, dyspnea on exertion, peripheral edema, incontinence, muscle weakness, transient blindness, and difficulty walking.    Physical Exam  General:  Well-developed,well-nourished,in no acute distress; alert,appropriate and cooperative throughout examination Eyes:  pupils equal, pupils round, and pupils reactive to light.   Ears:  External ear exam shows no significant lesions or deformities.  Otoscopic examination reveals clear canals, tympanic membranes are intact bilaterally without bulging, retraction, inflammation or discharge. Hearing is grossly normal bilaterally. Mouth:  Oral mucosa and oropharynx without lesions or exudates.  Teeth in good repair. Neck:  No deformities, masses, or tenderness noted. Lungs:  Normal respiratory effort, chest expands symmetrically. Lungs are clear to auscultation, no crackles or wheezes. Heart:  normal rate, regular rhythm, and no murmur.   Neurologic:  alert & oriented X3, cranial nerves II-XII intact, strength normal in all extremities, gait normal, and finger-to-nose normal.   Psych:  normally interactive, good eye contact, and slightly anxious.     Impression & Recommendations:  Problem # 1:  MRI, BRAIN, ABNORMAL (ICD-794.09) discussed findings with husband and wife.  Less likely CVA with hx of intermittent symptoms which would make demyelinating process seem more likely.  Family requesting if we can move up appt  with neurology and we are trying to do so.  Problem # 2:  ESSENTIAL HYPERTENSION (ICD-401.9) reduce blood pressure medication and hold alltogether for systolic < 100.  Go to one-half Benicar 20. Her updated medication list for this problem includes:    Benicar Hct 20-12.5 Mg Tabs (Olmesartan medoxomil-hctz) ..... One by mouth once daily  Complete Medication List: 1)  Levothroid 125 Mcg Tabs (Levothyroxine sodium) .... Once daily 2)  Crestor 10  Mg Tabs (Rosuvastatin calcium) .... Once daily 3)  Benicar Hct 20-12.5 Mg Tabs (Olmesartan medoxomil-hctz) .... One by mouth once daily 4)  Meclizine Hcl 25 Mg Tabs (Meclizine hcl) .Marland Kitchen.. 1 q 4 hours as needed for dizziness  Patient Instructions: 1)  Start baby aspirin 81 mg daily 2)  Hold meclizine 3)  Stop Benicar HCTZ and start back Benicar 20 mg one half tablet daily.

## 2010-05-10 NOTE — Assessment & Plan Note (Signed)
Summary: MED CK / CONCERNS W/ PRESENT MEDS // RS   Vital Signs:  Patient profile:   55 year old female Menstrual status:  postmenopausal Weight:      160 pounds Temp:     98.0 degrees F oral BP sitting:   160 / 82  (left arm) Cuff size:   regular  Vitals Entered By: [name] LPN (July 05, 2009 11:24 AM)   Serial Vital Signs/Assessments:  Time      Position  BP       Pulse  Resp  Temp     By                     174/84                         Evelena Peat MD           R Arm     180/84                         Evelena Peat MD           L Arm     174/84                         Evelena Peat MD  CC: med check, concerns with meds CBG Result 101   History of Present Illness: Patient has history of hypothyroidism and hyperlipidemia. She presents today with complaints of not feeling well for the past several days. Symptoms are somewhat vague but she's had some intermittent episodes of dizziness. She recalls first episode when she was cleaning out her garage and stood up suddenly and felt very lightheaded. No syncope. She describes some sensation of imbalance, ?vertigo. Question of binocular diplopia which was very transient couple of occasions. No headaches. No focal weakness.  Recent low back pain and patient considered whether Crestor could be causing. Symptoms resolved after stopping Crestor. No other myalgias. Recent lipids revealed good control. Hypothyroidism treated with Levothroid 125 micrograms daily. Patient compliant with that medication. Recent TSH normal.  Patient denies headaches, appetite change, weight change, hearing changes, chest pain, abdominal pain, focal weakness, or any paresthesias  Preventive Screening-Counseling & Management  Alcohol-Tobacco     Smoking Status: quit  Allergies (verified): No Known Drug Allergies  Past History:  Past Surgical History: Last updated: 09/14/2008 Thyroidectomy 2002  Family History: Last updated:  09/14/2008 Family History of Arthritis Family history heart disease due to malaria, parent family history prostate cancer, grandfather family history kidney disease, blood relative family historu diabetes, blood relative  Social History: Last updated: 07/05/2009 Occupation: Married Former Smoker  Past Medical History: Arthritis Hypertension Hyperlipidemia Kidney stones Hypothyroid PMH-FH-SH reviewed for relevance  Social History: Occupation: Married Former Smoker Smoking Status:  quit  Review of Systems      See HPI  Physical Exam  General:  Well-developed,well-nourished,in no acute distress; alert,appropriate and cooperative throughout examination Head:  Normocephalic and atraumatic without obvious abnormalities. No apparent alopecia or balding. Eyes:  pupils equal, pupils round, pupils reactive to light, no injection, and no retinal abnormalitiies.  EOMI Ears:  External ear exam shows no significant lesions or deformities.  Otoscopic examination reveals clear canals, tympanic membranes are intact bilaterally without bulging, retraction, inflammation or discharge. Hearing is grossly normal bilaterally. Mouth:  Oral mucosa and oropharynx without lesions or exudates.  Teeth in good repair. Neck:  No deformities, masses, or tenderness noted. Lungs:  Normal respiratory effort, chest expands symmetrically. Lungs are clear to auscultation, no crackles or wheezes. Heart:  normal rate and regular rhythm.   Extremities:  no edema. Neurologic:  alert & oriented X3, cranial nerves II-XII intact, strength normal in all extremities, gait normal, DTRs symmetrical and normal, finger-to-nose normal, and Romberg negative.   Psych:  normally interactive, good eye contact, not anxious appearing, and not depressed appearing.     Impression & Recommendations:  Problem # 1:  HYPERLIPIDEMIA (ICD-272.4)  Her updated medication list for this problem includes:    Crestor 10 Mg Tabs  (Rosuvastatin calcium) ..... Once daily  Problem # 2:  HYPOTHYROIDISM (ICD-244.9) TSH stable Her updated medication list for this problem includes:    Levothroid 125 Mcg Tabs (Levothyroxine sodium) ..... Once daily  Problem # 3:  DIZZINESS (ICD-780.4) ?etiology. ?vertiginous.  Neuro nonfocal but will need further assessment at f/u if symptoms persist, esp if further episodes of binocular diplopia.  CBG stable.  Problem # 4:  INCREASED BLOOD PRESSURE (ICD-796.2) Assessment: New No prior hx of signif BP elevation.  Bring back to reassess within one week and she will monitor regularly at home.  Problem # 5:  BACK PAIN, LUMBAR (ICD-724.2) Assessment: Improved pt thinks related to Crestor but doubtful statin related.  Complete Medication List: 1)  Levothroid 125 Mcg Tabs (Levothyroxine sodium) .... Once daily 2)  Crestor 10 Mg Tabs (Rosuvastatin calcium) .... Once daily  Other Orders: Capillary Blood Glucose/CBG (47829) Capillary Blood Glucose/CBG (56213)  Patient Instructions: 1)  Follow up in one week for recheck. 2)  Check your  Blood Pressure regularly .  Bring blood pressure readings at follow up.

## 2010-05-10 NOTE — Progress Notes (Signed)
Summary: talk to nurse  Phone Note Call from Patient Call back at Home Phone [phone] Call back at [phone]   Caller: Patient Reason for Call: Talk to Nurse Summary of Call: pt would like to talk to nurse or Dr Eden Emms about her echo.  Initial call taken by: Edman Circle,  November 23, 2009 1:51 PM  Follow-up for Phone Call        spoke with pt, questions answered Deliah Goody, RN  November 23, 2009 2:02 PM

## 2010-05-10 NOTE — Progress Notes (Signed)
Summary: FYI  Phone Note Call from Patient   Caller: Patient Call For: Evelena Peat MD Summary of Call: Pt wants Dr. Caryl Never to know she spoke to the pharmacist about the reaction she had, and was told this is a very common side effect when changing from plain Benicar to Benicar plus HCTZ. Initial call taken by: Lynann Beaver CMA,  September 17, 2009 1:22 PM  Follow-up for Phone Call        noted. Follow-up by: Evelena Peat MD,  September 17, 2009 1:28 PM

## 2010-05-10 NOTE — Progress Notes (Signed)
Summary: hypotension?  Phone Note Call from Patient   Caller: Patient Call For: Evelena Peat MD Summary of Call: Pt is concerned about BP being too low.  Reading are 95/56 and 100/59.  Questioning whether she should change any of her meds?? [phone] [facility]) Initial call taken by: Lynann Beaver CMA,  December 21, 2009 10:26 AM  Follow-up for Phone Call        not unless she is having any dizziness.  If she has orthostatic type of dizziness, reduce Losartan to one half tablet.  Losartan does'nt come in lower dose. Follow-up by: Evelena Peat MD,  December 21, 2009 12:34 PM  Additional Follow-up for Phone Call Additional follow up Details #1::        Notified pt. Additional Follow-up by: Lynann Beaver CMA,  December 21, 2009 12:36 PM

## 2010-05-12 NOTE — Letter (Signed)
Summary: [facility]  [facility]   Imported By: Maryln Gottron 04/07/2010 15:57:55  _____________________________________________________________________  External Attachment:    Type:   Image     Comment:   External Document

## 2010-06-14 ENCOUNTER — Other Ambulatory Visit: Payer: Self-pay | Admitting: Family Medicine

## 2010-06-16 ENCOUNTER — Encounter: Payer: Self-pay | Admitting: Family Medicine

## 2010-06-17 ENCOUNTER — Encounter: Payer: Self-pay | Admitting: Family Medicine

## 2010-06-17 ENCOUNTER — Ambulatory Visit (INDEPENDENT_AMBULATORY_CARE_PROVIDER_SITE_OTHER): Payer: BC Managed Care – PPO | Admitting: Family Medicine

## 2010-06-17 DIAGNOSIS — E785 Hyperlipidemia, unspecified: Secondary | ICD-10-CM

## 2010-06-17 DIAGNOSIS — E039 Hypothyroidism, unspecified: Secondary | ICD-10-CM

## 2010-06-17 DIAGNOSIS — I10 Essential (primary) hypertension: Secondary | ICD-10-CM

## 2010-06-17 LAB — HEPATIC FUNCTION PANEL
ALT: 12 U/L (ref 0–35)
Albumin: 4 g/dL (ref 3.5–5.2)
Bilirubin, Direct: 0.1 mg/dL (ref 0.0–0.3)
Total Protein: 6.7 g/dL (ref 6.0–8.3)

## 2010-06-17 LAB — LIPID PANEL: Cholesterol: 165 mg/dL (ref 0–200)

## 2010-06-17 LAB — TSH: TSH: 2.17 u[IU]/mL (ref 0.35–5.50)

## 2010-06-17 NOTE — Progress Notes (Signed)
Quick Note:  Pt informed ______ 

## 2010-06-17 NOTE — Patient Instructions (Signed)
Consider LacHydrin as skin moisturizer.

## 2010-06-17 NOTE — Progress Notes (Signed)
  Subjective:    Patient ID: Haley Montgomery, female    DOB: 07-13-1955, 55 y.o.   MRN: [id number]  HPI  Followup multiple medical problems. The patient has hypothyroidism and needs followup labs. Hypertension treated with Cozaar 50 mg daily. She has occasional episodes of dizziness which come on suddenly and are very infrequent usually occur about every 3 months. No syncope but feels dizzy with standing. On one occasion blood pressure measured was 90/55. Generally her blood pressure runs around 140/80.   Patient has hyperlipidemia treated with Crestor. No side effects. Compliant with treatment. No history of CAD or peripheral vascular disease.  Complains of dry skin. Has tried various moisturizers without much improvement.   Review of Systems  Constitutional: Negative for fever, fatigue and unexpected weight change.  HENT: Negative for neck pain.   Eyes: Negative for visual disturbance.  Respiratory: Negative for cough and shortness of breath.   Cardiovascular: Negative for chest pain, palpitations and leg swelling.  Musculoskeletal: Negative for gait problem.  Neurological: Negative for seizures and headaches.  Hematological: Negative for adenopathy.       Objective:   Physical Exam  patient is alert and in no distress Oropharynx is clear Neck supple no masses Chest clear auscultation Heart regular rhythm and rate Extremities no edema Skin generally dry no rash       Assessment & Plan:   #1 hypertension adequately controlled  #2 hyperlipidemia recheck lipids and hepatic panel #3 hypothyroidism recheck TSH #4 dry skin. Try Lac-Hydrin moisturizer

## 2010-06-19 ENCOUNTER — Encounter: Payer: Self-pay | Admitting: Family Medicine

## 2010-06-20 LAB — POCT I-STAT 4, (NA,K, GLUC, HGB,HCT)
Glucose, Bld: 96 mg/dL (ref 70–99)
HCT: 45 % (ref 36.0–46.0)
Hemoglobin: 15.3 g/dL — ABNORMAL HIGH (ref 12.0–15.0)
Sodium: 142 mEq/L (ref 135–145)

## 2010-07-31 ENCOUNTER — Other Ambulatory Visit: Payer: Self-pay | Admitting: Family Medicine

## 2010-08-26 NOTE — Procedures (Signed)
NAME:  Haley Montgomery, Haley Montgomery                ACCOUNT NO.:  [id number]   MEDICAL RECORD NO.:  [id number]          PATIENT TYPE:  OUT   LOCATION:  SLEEP CENTER                 FACILITY:  [facility]   PHYSICIAN:  Clinton D. Maple Hudson, M.D. DATE OF BIRTH:  31-May-1955   DATE OF STUDY:                              NOCTURNAL POLYSOMNOGRAM   DATE OF STUDY:  April 14, 2004.   INDICATION FOR STUDY:  Hypersomnia with sleep apnea.  Epworth Sleepiness  Score 12/24, BMI 22.9, weight 148 pounds.  History of dental pain attributed  to sleep problems.   SLEEP ARCHITECTURE:  Total sleep time 367 minutes with sleep efficiency 77%.  Stage 1 was 7%, stage 2 60%, stages 3 and 4 13%, REM was 20% of total sleep  time.  Sleep latency 45 minutes, REM latency 162 minutes, awake after sleep  onset 66 minutes, arousal index 37.  No bedtime medications were taken.   RESPIRATORY DATA:  RDI 2.3 per hour reflecting 14 hypopneas.  This is within  normal limits and does not need criteria for diagnosis of sleep disordered  breathing.  Events were recorded while sleeping supine and on her right  side.  REM RDI was 5.6 per hour.   OXYGEN DATA:  Moderate snoring with oxygen desaturation to a nadir of 88%.  Mean oxygen saturation through the study was 92-93% on room air.   CARDIAC DATA:  Sinus rhythm and mild sinus bradycardia.   MOVEMENT/PARASOMNIA:  Insignificant, occasional leg jerk.   IMPRESSION/RECOMMENDATION:  No significant sleep disordered breathing, RDI  2.3 per hour.  There were intervals of sustained awakening, which seemed  nonspecific, but no physiologic sleep disturbance was identified.                                                           Clinton D. Maple Hudson, M.D.  Diplomate, American Board   CDY/MEDQ  D:  04/17/2004 12:03:14  T:  04/17/2004 14:36:00  Job:  [id number]

## 2010-08-26 NOTE — Op Note (Signed)
[facility]  Patient:    Haley Montgomery, Haley Montgomery                       MRN: [id number] Proc. Date: 08/06/00 Adm. Date:  [id number] Attending:  Bonnetta Barry CC:         Richard A. Jacky Kindle, M.D.  Teena Irani. Arlyce Dice, M.D.   Operative Report  PREOPERATIVE DIAGNOSIS:  Graves disease, failure of medical management.  POSTOPERATIVE DIAGNOSIS:  Graves disease, failure of medical management.  PROCEDURE:  Total thyroidectomy.  SURGEON:  Velora Heckler, M.D.  FIRST ASSISTANT:  Rose Phi. Maple Hudson, M.D.  ANESTHESIA:  General.  ESTIMATED BLOOD LOSS:  Minimal.  PREPARATION:  Betadine.  COMPLICATIONS:  None.  INDICATIONS:  The patient is a 55 year old white female with symptoms of Graves disease dating back to the fall of 2000.  The patient was diagnosed in the summer of 2001.  She underwent workup and was treated with radioactive iodine in November of 2001 at [facility].  The patient had subsequent persistent elevation of her thyroid function tests.  She was placed on Tapazole.  She had poor control and was admitted on one occasion to [facility] with impending thyroid storm.  She was controlled with beta blockade, Tapazole, and IV steroids.  The patient now presents for surgical management of Graves disease.  DESCRIPTION OF PROCEDURE:  The procedure was done in OR #2 at the [facility].  The patient was brought to the operating room and placed in the supine on the operating room table.  Following administration of general anesthesia, the patient is positioned, and then prepped and draped in the usual strict aseptic fashion.  After ascertaining that an adequate level of anesthesia had been obtained, a standard Kocher incision was made with a #10 blade.  Dissection was carried through the subcutaneous tissues and platysma.  Hemostasis was obtained with the electrocautery.  Skin flaps were developed cephalad and caudad from the  thyroid notch to the sternal notch.  A Mahorner self-retaining retractor was placed for exposure.  The strap muscles were incised in the midline.  Dissection was begun on the left side of the neck.  Strap muscles were reflected laterally.  The left lobe is firm, hard, and somewhat adherent to the surrounding tissues.  Dissection was carried laterally.  The middle thyroid vein is divided between small Ligaclips.  The superior pole was dissected out and superior pole vessels are divided between medium Ligaclips.  The gland is rolled anteriorly.  The superior parathyroid gland is identified and preserved.  Branches of the inferior thyroid artery are divided between small Ligaclips.  The gland is rolled further anteriorly. Dissection was continued right on the capsule of the gland.  The ligament of Allyson Sabal was transected and the gland is rolled further medially up and on to the anterior surface of the trachea.  The inferior thyroid veins are divided between Ligaclips.  Next, we turned our attention to the right side.  Again, strap muscles are reflected laterally.  Again, a large firm hard gland is noted without discrete nodules.  Again, middle thyroid vein is divided between medium Ligaclips.  The inferior thyroid venous tributaries are divided between small and medium Ligaclips.  Branches of the inferior thyroid artery are divided between small Ligaclips.  The gland is rolled anteriorly.  Superior pole vessels are divided between hemostats and ligated with 2-0 silk ties.  The ligament of Allyson Sabal is transected, and the  gland is rolled anteriorly.  The gland is then excised off the anterior surface of the trachea using the electrocautery for hemostasis.  Both sides of the neck are irrigated copiously with warm saline.  Sheets of Surgicel are placed over the area of the recurrent laryngeal nerve bilaterally.  Good hemostasis is noted.  The entire gland is submitted for permanent sectioning.  Strap  muscles were reapproximated in the midline with interrupted 3-0 Vicryl sutures.  The platysma was reapproximated with interrupted 3-0 Vicryl sutures.  The skin edges are reapproximated with stainless steel staples and 1/2 inch Steri-Strips and Benzoin.  Sterile gauze dressings were placed.  The patient was awakened from anesthesia and brought to the recovery room in stable condition.  The patient tolerated the procedure well. DD:  08/06/00 TD:  08/06/00 Job: [id number]

## 2010-09-10 ENCOUNTER — Other Ambulatory Visit: Payer: Self-pay | Admitting: Family Medicine

## 2010-12-08 ENCOUNTER — Other Ambulatory Visit: Payer: Self-pay | Admitting: Family Medicine

## 2011-02-06 ENCOUNTER — Encounter: Payer: Self-pay | Admitting: Family Medicine

## 2011-02-06 ENCOUNTER — Ambulatory Visit (INDEPENDENT_AMBULATORY_CARE_PROVIDER_SITE_OTHER): Payer: BC Managed Care – PPO | Admitting: Family Medicine

## 2011-02-06 VITALS — BP 120/72 | Temp 98.0°F | Wt 157.0 lb

## 2011-02-06 DIAGNOSIS — J45909 Unspecified asthma, uncomplicated: Secondary | ICD-10-CM

## 2011-02-06 MED ORDER — AZITHROMYCIN 250 MG PO TABS: ORAL_TABLET | ORAL | Status: AC

## 2011-02-06 MED ORDER — ALBUTEROL SULFATE HFA 108 (90 BASE) MCG/ACT IN AERS: 2.0000 | INHALATION_SPRAY | Freq: Four times a day (QID) | RESPIRATORY_TRACT | Status: DC | PRN

## 2011-02-06 MED ORDER — METHYLPREDNISOLONE ACETATE 80 MG/ML IJ SUSP
80.0000 mg | Freq: Once | INTRAMUSCULAR | Status: AC
  Administered 2011-02-06: 80 mg via INTRAMUSCULAR

## 2011-02-06 NOTE — Progress Notes (Signed)
  Subjective:    Patient ID: Haley Montgomery, female    DOB: 09-30-1955, 55 y.o.   MRN: [id number]  HPI  Patient has a one-week history of productive cough. Nasal congestion with postnasal drainage. Increased wheezing. No history of asthma. Former smoker- quit 2004. Denies fever. Several family members with similar symptoms recently. Cough is just become productive past couple days. Cough and wheezing have worsened past few days. No nausea, vomiting, or diarrhea.  Review of Systems As per history of present illness    Objective:   Physical Exam  Constitutional: She appears well-developed and well-nourished.  HENT:  Right Ear: External ear normal.  Left Ear: External ear normal.  Mouth/Throat: Oropharynx is clear and moist.  Neck: Neck supple.  Cardiovascular: Normal rate and regular rhythm.   Pulmonary/Chest:       Patient has some diffuse wheezes. No rales. No retractions  Musculoskeletal: She exhibits no edema.  Lymphadenopathy:    She has no cervical adenopathy.  Neurological: She is alert.          Assessment & Plan:  Asthmatic bronchitis. Albuterol inhaler for prn use. Depo-Medrol 80 mg IM. Start Zithromax for 5 days

## 2011-02-20 ENCOUNTER — Ambulatory Visit (INDEPENDENT_AMBULATORY_CARE_PROVIDER_SITE_OTHER): Payer: BC Managed Care – PPO | Admitting: Family Medicine

## 2011-02-20 ENCOUNTER — Encounter: Payer: Self-pay | Admitting: Family Medicine

## 2011-02-20 VITALS — BP 120/78 | Temp 98.4°F | Wt 158.0 lb

## 2011-02-20 DIAGNOSIS — R300 Dysuria: Secondary | ICD-10-CM

## 2011-02-20 DIAGNOSIS — N390 Urinary tract infection, site not specified: Secondary | ICD-10-CM

## 2011-02-20 DIAGNOSIS — Z23 Encounter for immunization: Secondary | ICD-10-CM

## 2011-02-20 LAB — POCT URINALYSIS DIPSTICK
Glucose, UA: NEGATIVE
Nitrite, UA: NEGATIVE
pH, UA: 6

## 2011-02-20 MED ORDER — SULFAMETHOXAZOLE-TRIMETHOPRIM 800-160 MG PO TABS: 1.0000 | ORAL_TABLET | Freq: Two times a day (BID) | ORAL | Status: AC

## 2011-02-20 NOTE — Patient Instructions (Signed)
Drink lots of water.  Septra one twice daily to the bottle empty.  Return p.r.n.

## 2011-02-20 NOTE — Progress Notes (Signed)
  Subjective:    Patient ID: Haley Montgomery, female    DOB: 19-Mar-1956, 55 y.o.   MRN: [id number]  HPI Haley Montgomery a 55 year old female, nonsmoker, patient of Dr. Sharman Cheek, Who comes in today for evaluation of urinary tract symptoms for 4 days.  She's had dysuria.  No fever, chills, or back pain.  Last year.  She had a urologic procedure for prolapsed bladder.  She's been symptom free since that time.  Her last urinary tract infection was over 20 years ago.   Review of Systems General and neurologic review of systems otherwise negative    Objective:   Physical Exam Well-developed well-nourished, female, in no acute distress.  Examination the abdomen is normal       Assessment & Plan:  UTI plan Septra b.i.d. For 10 days.  Return p.r.n.

## 2011-05-23 LAB — HM PAP SMEAR: HM Pap smear: NEGATIVE

## 2011-06-03 ENCOUNTER — Other Ambulatory Visit: Payer: Self-pay | Admitting: Family Medicine

## 2011-06-06 ENCOUNTER — Other Ambulatory Visit: Payer: Self-pay | Admitting: *Deleted

## 2011-06-06 MED ORDER — LOSARTAN POTASSIUM 50 MG PO TABS: 50.0000 mg | ORAL_TABLET | Freq: Every day | ORAL | Status: DC

## 2011-06-20 ENCOUNTER — Other Ambulatory Visit: Payer: Self-pay | Admitting: Family Medicine

## 2011-06-27 ENCOUNTER — Other Ambulatory Visit: Payer: Self-pay | Admitting: Gynecology

## 2011-06-27 DIAGNOSIS — N6452 Nipple discharge: Secondary | ICD-10-CM

## 2011-07-03 ENCOUNTER — Ambulatory Visit
Admission: RE | Admit: 2011-07-03 | Discharge: 2011-07-03 | Disposition: A | Discharge status: Home / Self Care | Payer: BC Managed Care – PPO | Source: Ambulatory Visit | Attending: Gynecology | Admitting: Gynecology

## 2011-07-03 ENCOUNTER — Other Ambulatory Visit: Payer: Self-pay | Admitting: Gynecology

## 2011-07-03 DIAGNOSIS — N6452 Nipple discharge: Secondary | ICD-10-CM

## 2011-07-26 ENCOUNTER — Other Ambulatory Visit: Payer: Self-pay | Admitting: Family Medicine

## 2011-07-28 ENCOUNTER — Telehealth: Payer: Self-pay | Admitting: Family Medicine

## 2011-07-28 NOTE — Telephone Encounter (Signed)
Pt would like nancy to return her call concerning husband pain medication also getting shingles vaccine

## 2011-07-28 NOTE — Telephone Encounter (Signed)
I called [facility], spoke with pharmacist Misty Stanley who reports pt gets Norco from Dr Rennis Chris.  Last filled 4-15, #40 with 0 refills. Before that on 2-18, sig was changed to 1-2 tabs every 4-6 hours prn pain, #50 with 0 refills.  2-24 received #40 with 0 refills.  Dr Lucie Leather Rx are being filled at [facility] in [place].

## 2011-07-28 NOTE — Telephone Encounter (Signed)
Pt Haley Montgomery [place]'s chart was opened and all information was entered there also.

## 2011-07-28 NOTE — Telephone Encounter (Signed)
PC from wife reporting her husband is getting Norco 7.5-325 from Dr Caryl Never and Dr. Rennis Chris, his Orthopedic surgeon.  Norco was last filled by Dr Caryl Never on 06/12/11, #120 with 2 refills. Pt did sign a [place] Controlled Substance Contract on 09-19-10

## 2011-07-30 NOTE — Telephone Encounter (Signed)
If pt is asking for shingles vaccine for herself, this is currently not covered by insurance until age 56.

## 2011-07-31 NOTE — Telephone Encounter (Signed)
Message left on pt personally identified VM we have the vaccine and would be happy to administer, $290.00.

## 2011-08-28 ENCOUNTER — Encounter: Payer: Self-pay | Admitting: Family Medicine

## 2011-08-28 ENCOUNTER — Ambulatory Visit (INDEPENDENT_AMBULATORY_CARE_PROVIDER_SITE_OTHER): Payer: BC Managed Care – PPO | Admitting: Family Medicine

## 2011-08-28 VITALS — BP 124/78 | Temp 98.0°F | Wt 160.0 lb

## 2011-08-28 DIAGNOSIS — R059 Cough, unspecified: Secondary | ICD-10-CM

## 2011-08-28 DIAGNOSIS — R05 Cough: Secondary | ICD-10-CM

## 2011-08-28 MED ORDER — METHYLPREDNISOLONE ACETATE 80 MG/ML IJ SUSP: 80.0000 mg | Freq: Once | INTRAMUSCULAR | Status: DC

## 2011-08-28 NOTE — Patient Instructions (Signed)
Follow up promptly for any fever or worsening symptoms 

## 2011-08-28 NOTE — Progress Notes (Signed)
  Subjective:    Patient ID: Haley Montgomery, female    DOB: 10-18-1955, 56 y.o.   MRN: [id number]  HPI  Patient seen with sore throat, cough, and postnasal drip symptoms. Symptoms started last week initial sore throat. She took some Allegra without relief. Went to ophthalmologist for some type of left eye infection and was prescribed doxycycline. After taking only 2 pills she had worsening laryngitis and sore throat-and stopped medication. Denies any GERD symptoms. No significant dysphagia. No associated fever.  Allegra without relief of postnasal drip symptoms. She has some mild wheezing off and on. Patient is a former smoker. No hemoptysis. No dyspnea   Review of Systems  Constitutional: Negative for fever and chills.  HENT: Positive for congestion, sore throat, voice change and postnasal drip. Negative for trouble swallowing.   Respiratory: Positive for cough and wheezing.   Cardiovascular: Negative for chest pain.       Objective:   Physical Exam  Constitutional: She appears well-developed and well-nourished.  HENT:  Right Ear: External ear normal.  Left Ear: External ear normal.  Mouth/Throat: Oropharynx is clear and moist.  Neck: Neck supple. No thyromegaly present.  Cardiovascular: Normal rate and regular rhythm.   Pulmonary/Chest: Effort normal.       Patient is a mild expiratory wheezes. No rales  Lymphadenopathy:    She has no cervical adenopathy.          Assessment & Plan:  Probable viral syndrome. Patient has mild reactive airway component. Depo-Medrol 80 mg given. Continue Mucinex. Followup as needed

## 2011-09-15 ENCOUNTER — Ambulatory Visit (INDEPENDENT_AMBULATORY_CARE_PROVIDER_SITE_OTHER): Payer: BC Managed Care – PPO | Admitting: Family Medicine

## 2011-09-15 ENCOUNTER — Encounter: Payer: Self-pay | Admitting: Family Medicine

## 2011-09-15 VITALS — BP 150/78 | Temp 98.0°F | Wt 157.0 lb

## 2011-09-15 DIAGNOSIS — L02214 Cutaneous abscess of groin: Secondary | ICD-10-CM

## 2011-09-15 DIAGNOSIS — L02219 Cutaneous abscess of trunk, unspecified: Secondary | ICD-10-CM

## 2011-09-15 DIAGNOSIS — L03319 Cellulitis of trunk, unspecified: Secondary | ICD-10-CM

## 2011-09-15 MED ORDER — CEPHALEXIN 500 MG PO CAPS: 500.0000 mg | ORAL_CAPSULE | Freq: Three times a day (TID) | ORAL | Status: AC

## 2011-09-15 NOTE — Patient Instructions (Signed)
Keep wound dry May change outer dressing but try to leave packing gauze intact We will plan to remove packing on Monday.

## 2011-09-15 NOTE — Progress Notes (Signed)
  Subjective:    Patient ID: Haley Montgomery, female    DOB: 1955-06-24, 56 y.o.   MRN: [id number]  HPI  Patient has a three-day history of progressive redness and swelling left groin. No fever or chills. Pain with ambulation. No prior history of abscess. No history of MRSA. She recently had intolerance to doxycycline.   Review of Systems  Constitutional: Negative for fever and chills.  Hematological: Negative for adenopathy.       Objective:   Physical Exam  Constitutional: She appears well-developed and well-nourished.  Cardiovascular: Normal rate and regular rhythm.   Skin:       Area of erythema 8 x 8 cm. Near center 1 cm diameter area of fluctuance and erythema and tenderness          Assessment & Plan:  Abcess left groin. Recommended I&D and patient consents after discussing risks and benefits. Skin prepped with Betadine. Anesthesia 1% Xylocaine without epinephrine. A one cm linear incision #11 blade. Using hemostats and copious pus expressed. Gauze packing applied.  Outer dressing applied. Return in 3 days for packing removal. Previous intolerance to doxycycline and possibly Septra. Start Keflex 500 mg 3 times a day for 7 days

## 2011-09-18 ENCOUNTER — Other Ambulatory Visit: Payer: Self-pay | Admitting: Family Medicine

## 2011-09-18 ENCOUNTER — Encounter: Payer: Self-pay | Admitting: Family Medicine

## 2011-09-18 ENCOUNTER — Ambulatory Visit (INDEPENDENT_AMBULATORY_CARE_PROVIDER_SITE_OTHER): Payer: BC Managed Care – PPO | Admitting: Family Medicine

## 2011-09-18 VITALS — BP 130/82 | Temp 98.0°F

## 2011-09-18 DIAGNOSIS — L02214 Cutaneous abscess of groin: Secondary | ICD-10-CM

## 2011-09-18 DIAGNOSIS — L02219 Cutaneous abscess of trunk, unspecified: Secondary | ICD-10-CM

## 2011-09-18 NOTE — Patient Instructions (Signed)
Warm sitz bath daily for the next 3-4 days Topical antibiotic and keep outer dressing until drainage ceases Follow up for any recurrent redness, swelling, or pain

## 2011-09-18 NOTE — Progress Notes (Signed)
  Subjective:    Patient ID: Haley Montgomery, female    DOB: 1955/12/06, 56 y.o.   MRN: [id number]  HPI  Followup abscess left groin. Patient did well over the weekend but did have significant pain off and on. No fever or chills. She replaced outer dressing several times because of drainage. Remains on antibiotic with no adverse side effects.   Review of Systems  Constitutional: Negative for fever and chills.  Hematological: Negative for adenopathy.       Objective:   Physical Exam  Constitutional: She appears well-developed and well-nourished.  Cardiovascular: Normal rate and regular rhythm.   Pulmonary/Chest: Effort normal and breath sounds normal. No respiratory distress. She has no wheezes.  Skin:       Wound left groin is examined. She's had almost total resolution of cellulitis changes noted previously. Packing is removed. Wound cavity appears good with no evidence for residual purulence. Slightly tender. Nonfluctuant          Assessment & Plan:  Abscess left groin improved. Leave out packing at this point. Start daily sitz baths. Topical antibiotic. Followup promptly for signs of recurrent infection

## 2011-09-28 ENCOUNTER — Other Ambulatory Visit: Payer: Self-pay | Admitting: Family Medicine

## 2011-10-23 ENCOUNTER — Telehealth: Payer: Self-pay | Admitting: Family Medicine

## 2011-10-23 NOTE — Telephone Encounter (Signed)
Caller: Jalaya/Patient; PCP: Evelena Peat; CB#: [phone]; ; ; Call regarding F/U To Infections;  Pt calling regarding recurrence of left inner thigh infection. Seen on 6/10 for same. I & D and at that time and pt has completed Keflex Rx. Pt states swelling is siginificant again. Afebrile. Emergent sxs r/o. Requests an appt for 7/16. Transfered to [facility] for appt. per pt request. [facility] could not access EPIC.

## 2011-10-24 ENCOUNTER — Encounter: Payer: Self-pay | Admitting: Family Medicine

## 2011-10-24 ENCOUNTER — Other Ambulatory Visit: Payer: Self-pay | Admitting: Family Medicine

## 2011-10-24 ENCOUNTER — Ambulatory Visit (INDEPENDENT_AMBULATORY_CARE_PROVIDER_SITE_OTHER): Payer: BC Managed Care – PPO | Admitting: Family Medicine

## 2011-10-24 VITALS — BP 140/82 | Temp 98.5°F | Wt 166.0 lb

## 2011-10-24 DIAGNOSIS — L02419 Cutaneous abscess of limb, unspecified: Secondary | ICD-10-CM

## 2011-10-24 DIAGNOSIS — L02416 Cutaneous abscess of left lower limb: Secondary | ICD-10-CM

## 2011-10-24 MED ORDER — SULFAMETHOXAZOLE-TRIMETHOPRIM 800-160 MG PO TABS: 1.0000 | ORAL_TABLET | Freq: Two times a day (BID) | ORAL | Status: AC

## 2011-10-24 NOTE — Patient Instructions (Addendum)
Continue warm compresses to left eye at several times daily Will call you in a few days with culture results Start antibiotic if you have any progressive swelling, pain, redness, or fever

## 2011-10-24 NOTE — Progress Notes (Signed)
  Subjective:    Patient ID: Haley Montgomery, female    DOB: [birth year], 56 y.o.   MRN: [id number]  HPI  Patient had recent abscess left thigh. This fully resolved after treatment with antibiotics and incision and drainage. This past Friday she does recurrent painful swelling left upper inner thigh. This apparently spontaneously drained some pus over the weekend. No fever or chills. Feels somewhat better today. No prior history of MRSA.   Review of Systems  Constitutional: Negative for fever and chills.  Hematological: Negative for adenopathy.       Objective:   Physical Exam  Constitutional: She appears well-developed and well-nourished.  Cardiovascular: Normal rate and regular rhythm.   Skin:       Patient has area of very minimal erythema about 1/2 cm diameter with central open area about 1 mm with very minimal mostly clear serous-type drainage. No fluctuance. Nontender. No cellulitis changes          Assessment & Plan:  Probable resolving abscess left thigh. Recurrent. Fluid cultured. Rule out MRSA. At this point, no clear indication for antibiotics but prescription for Septra DS one twice a day for 10 days if she has any recurrent erythema swelling or pain

## 2012-06-14 ENCOUNTER — Other Ambulatory Visit: Payer: BC Managed Care – PPO

## 2012-06-14 ENCOUNTER — Encounter: Payer: BC Managed Care – PPO | Admitting: Family Medicine

## 2012-06-19 ENCOUNTER — Ambulatory Visit (INDEPENDENT_AMBULATORY_CARE_PROVIDER_SITE_OTHER): Payer: BC Managed Care – PPO | Admitting: Family Medicine

## 2012-06-19 ENCOUNTER — Encounter: Payer: Self-pay | Admitting: Family Medicine

## 2012-06-19 VITALS — BP 150/90 | HR 72 | Temp 97.3°F | Resp 12 | Ht 64.75 in | Wt 175.0 lb

## 2012-06-19 DIAGNOSIS — N200 Calculus of kidney: Secondary | ICD-10-CM | POA: Insufficient documentation

## 2012-06-19 DIAGNOSIS — E039 Hypothyroidism, unspecified: Secondary | ICD-10-CM

## 2012-06-19 DIAGNOSIS — Z0000 Encounter for general adult medical examination without abnormal findings: Secondary | ICD-10-CM

## 2012-06-19 LAB — BASIC METABOLIC PANEL
CO2: 26 mEq/L (ref 19–32)
Calcium: 9.5 mg/dL (ref 8.4–10.5)
GFR: 61.58 mL/min (ref >60.00)
Glucose, Bld: 99 mg/dL (ref 70–99)
Potassium: 5.3 mEq/L — ABNORMAL HIGH (ref 3.5–5.1)
Sodium: 141 mEq/L (ref 135–145)

## 2012-06-19 LAB — CBC WITH DIFFERENTIAL/PLATELET
Basophils Absolute: 0 10*3/uL (ref 0.0–0.1)
Eosinophils Absolute: 0.2 10*3/uL (ref 0.0–0.7)
Eosinophils Relative: 3.6 % (ref 0.0–5.0)
HCT: 43.7 % (ref 36.0–46.0)
Lymphs Abs: 2.4 10*3/uL (ref 0.7–4.0)
MCV: 89.2 fl (ref 78.0–100.0)
Monocytes Absolute: 0.4 10*3/uL (ref 0.1–1.0)
Neutrophils Relative %: 49.6 % (ref 43.0–77.0)
Platelets: 199 10*3/uL (ref 150.0–400.0)
RDW: 13.4 % (ref 11.5–14.6)
WBC: 6.1 10*3/uL (ref 4.5–10.5)

## 2012-06-19 LAB — HEPATIC FUNCTION PANEL
Alkaline Phosphatase: 93 U/L (ref 39–117)
Bilirubin, Direct: 0.1 mg/dL (ref 0.0–0.3)

## 2012-06-19 LAB — LIPID PANEL
HDL: 45.8 mg/dL (ref >39.00)
Triglycerides: 126 mg/dL (ref 0.0–149.0)
VLDL: 25.2 mg/dL (ref 0.0–40.0)

## 2012-06-19 LAB — POCT URINALYSIS DIPSTICK
Bilirubin, UA: NEGATIVE
Blood, UA: NEGATIVE
Glucose, UA: NEGATIVE
Nitrite, UA: NEGATIVE
Spec Grav, UA: 1.01

## 2012-06-19 LAB — TSH: TSH: 0.34 u[IU]/mL — ABNORMAL LOW (ref 0.35–5.50)

## 2012-06-19 MED ORDER — ROSUVASTATIN CALCIUM 10 MG PO TABS: 10.0000 mg | ORAL_TABLET | Freq: Every day | ORAL | Status: DC

## 2012-06-19 MED ORDER — LOSARTAN POTASSIUM 100 MG PO TABS: 100.0000 mg | ORAL_TABLET | Freq: Every day | ORAL | Status: DC

## 2012-06-19 NOTE — Patient Instructions (Signed)
We will call you regarding lab work We will titrate your losartan to 100 mg daily Tried establish some type of regular physical exercise

## 2012-06-19 NOTE — Progress Notes (Signed)
Subjective:    Patient ID: Haley Montgomery, female    DOB: 1955/08/20, 57 y.o.   MRN: [id number]  HPI Patient here for physical. She sees gynecologist regularly gets regular mammograms and Pap smears. Her chronic problems include history of hypertension, hyperlipidemia, hypothyroidism.  Compliant with medications. Her major issues are increasing fatigue and gradual weight gain over the past few years. Generally sleeping well. No concerns for sleep apnea. No history of depression.  Frequently only eats about one to 2 meals per day. Sometimes poor appetite. Has noted increased dryness of her hair and brittle nails.  No regular exercise. Quit smoking 2004. Colonoscopy up to date. Immunizations up to date. Mammogram earlier this year  Patient complains of some foul-smelling urine and occasional frequency. No fevers or chills  Past Medical History  Diagnosis Date  . HYPOTHYROIDISM 09/14/2008  . HYPERLIPIDEMIA 09/14/2008  . ESSENTIAL HYPERTENSION 08/11/2009  . MRI, BRAIN, ABNORMAL 08/16/2009   Past Surgical History  Procedure Laterality Date  . Thyroidectomy  2002    reports that she quit smoking about 10 years ago. Her smoking use included Cigarettes. She has a 15 pack-year smoking history. She does not have any smokeless tobacco history on file. Her alcohol and drug histories are not on file. family history includes Arthritis in her other; Cancer in her other; Diabetes in her other; and Kidney disease in her other. Allergies  Allergen Reactions  . Denture Care Products (Sodium Bicarbonate)     Teeth yellowed, excessive sinus drainage  . Doxycycline     Sore throat, cough  . Olmesartan Medoxomil     Fainting spells (Benicar)      Review of Systems  Constitutional: Positive for fatigue and unexpected weight change (Weight gain). Negative for fever, activity change and appetite change.  HENT: Negative for hearing loss, ear pain, sore throat and trouble swallowing.   Eyes: Negative for  visual disturbance.  Respiratory: Negative for cough and shortness of breath.   Cardiovascular: Negative for chest pain and palpitations.  Gastrointestinal: Negative for abdominal pain, diarrhea, constipation and blood in stool.  Genitourinary: Positive for dysuria. Negative for hematuria.  Musculoskeletal: Negative for myalgias, back pain and arthralgias.  Skin: Negative for rash.  Neurological: Negative for dizziness, syncope and headaches.  Hematological: Negative for adenopathy.  Psychiatric/Behavioral: Negative for confusion and dysphoric mood.       Objective:   Physical Exam  Constitutional: She is oriented to person, place, and time. She appears well-developed and well-nourished.  HENT:  Head: Normocephalic and atraumatic.  Eyes: EOM are normal. Pupils are equal, round, and reactive to light.  Neck: Normal range of motion. Neck supple. No thyromegaly present.  Cardiovascular: Normal rate, regular rhythm and normal heart sounds.   No murmur heard. Pulmonary/Chest: Breath sounds normal. No respiratory distress. She has no wheezes. She has no rales.  Abdominal: Soft. Bowel sounds are normal. She exhibits no distension and no mass. There is no tenderness. There is no rebound and no guarding.  Musculoskeletal: Normal range of motion. She exhibits no edema.  Lymphadenopathy:    She has no cervical adenopathy.  Neurological: She is alert and oriented to person, place, and time. She displays normal reflexes. No cranial nerve deficit.  Skin: No rash noted.  Psychiatric: She has a normal mood and affect. Her behavior is normal. Judgment and thought content normal.          Assessment & Plan:  Complete physical. Patient had some recent weight gain and other symptoms such  as dry hair and brittle nails. Recheck labs including TSH. Needs to establish regular exercise. Immunizations up to date.

## 2012-06-20 NOTE — Progress Notes (Signed)
Quick Note:  Pt informed on cell VM ______ 

## 2012-07-12 ENCOUNTER — Encounter: Payer: BC Managed Care – PPO | Admitting: Family Medicine

## 2012-08-28 ENCOUNTER — Other Ambulatory Visit: Payer: Self-pay | Admitting: Family Medicine

## 2012-10-17 ENCOUNTER — Ambulatory Visit (INDEPENDENT_AMBULATORY_CARE_PROVIDER_SITE_OTHER): Payer: BC Managed Care – PPO | Admitting: Internal Medicine

## 2012-10-17 ENCOUNTER — Encounter: Payer: Self-pay | Admitting: Internal Medicine

## 2012-10-17 VITALS — BP 132/80 | HR 62 | Temp 98.4°F | Resp 10 | Ht 65.5 in | Wt 173.0 lb

## 2012-10-17 DIAGNOSIS — E032 Hypothyroidism due to medicaments and other exogenous substances: Secondary | ICD-10-CM

## 2012-10-17 LAB — TSH: TSH: 0.17 u[IU]/mL — ABNORMAL LOW (ref 0.35–5.50)

## 2012-10-17 LAB — T4, FREE: Free T4: 1.5 ng/dL (ref 0.60–1.60)

## 2012-10-17 NOTE — Patient Instructions (Addendum)
Please return in 6 months. Please join MyChart, I will release the lab results there +/- further adjustments in your Levothyroxine dose.

## 2012-10-17 NOTE — Progress Notes (Signed)
Subjective:     Patient ID: Haley Montgomery, female   DOB: December 01, 1955, 57 y.o.   MRN: [id number]  HPI Haley Montgomery is a 57 y/o woman referred by Haley Montgomery for management of hypothyroidism. She previously saw Haley Montgomery in [facility] ([facility]), and would like to establish care with me.  Pt was dx with a "thyroid problem" (likely hyperthyroidism) in 2001 - she describes paroxysms of hot flushes, blurry vision, anxiety, dizziness, fainting. She also had a very large goiter at that time. 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 She was treated with RAI ablation in 11/082001. She felt extremely poorly soon after the treatment and saw Haley Montgomery, who recommended surgery to remove the goiter. She had total thyroidectomy on 08/06/2000 - pathology reviewed with the pt: TOTAL THYROIDECTOMY: THYROID GLAND WITH FIBROSIS, FOCAL CHRONIC INFLAMMATION AND NONSPECIFIC EPITHELIAL ATYPIA, NO TUMOR IDENTIFIED. The thyroid gland displays some fibrosis, chronic inflammation and epithelial atypia. I suspect these changes may be secondary to suppressive therapy and the epithelial atypia is a nonspecific change.   Pt started Levothyroxine 125 mcg daily after the surgery. Dose was not recently changed (since 2009). Takes it in am with water, along with Cozaar and Crestor. Does not eat b'fast often, when she does, she eats it >30 min later. No PPI, MVI, calcium, iron.  Reviewed previous thyroid tests: Lab Results  Component Value Date   TSH 0.34* 06/19/2012   TSH 2.17 06/17/2010   TSH 2.18 05/14/2009   TSH 1.34 09/14/2008   Symptoms: Has gained 20 lbs in last 5 mo -  Central obesity - eating 1-2 meals a day. 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 Apparently no thyroid cancer FH.  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 She tells me that she saw 3 neurologists: multiple strokes vs Haley. She did not follow up with neurology. Also has HTN, HL, GERD.  Review of Systems Constitutional: + weight gain, + decreased appetite, + fatigue, + subjective hyperthermia Eyes: no blurry vision, no xerophthalmia ENT: no sore throat, no nodules palpated in throat, no dysphagia/odynophagia, no hoarseness Cardiovascular: no CP/SOB/palpitations/leg swelling Respiratory: no cough/SOB Gastrointestinal: no N/V/D/C, + GERD Musculoskeletal: no muscle/+ joint aches Skin: no rashes Neurological: no tremors/numbness/tingling/dizziness Psychiatric: no depression/anxiety  Past Medical History  Diagnosis Date  . HYPOTHYROIDISM 09/14/2008  . HYPERLIPIDEMIA 09/14/2008  . ESSENTIAL HYPERTENSION 08/11/2009  . MRI, BRAIN, ABNORMAL 08/16/2009   Past Surgical History  Procedure Laterality Date  . Thyroidectomy  2002   History   Social History  . Marital Status: Married    Spouse Name: N/A    Number of Children: 2: 33,11 y/o   Occupational History  . Conservation officer, [employer]   Social History Main Topics  . Smoking status: Former Smoker -- 0.50 packs/day for 30 years    Types: Cigarettes    Quit date: 2009  . Smokeless tobacco: no  . Alcohol Use: social  . Drug Use: No   Current Outpatient Prescriptions on  File Prior to Visit  Medication Sig Dispense Refill  . levothyroxine (SYNTHROID, LEVOTHROID) 125 MCG tablet TAKE 1 TABLET BY MOUTH EVERY DAY  90 tablet  0  . losartan (COZAAR) 100 MG tablet Take 1 tablet (100 mg total) by mouth daily.  30 tablet  11  . rosuvastatin (CRESTOR) 10 MG tablet Take 1 tablet  (10 mg total) by mouth daily.  30 tablet  11   Current Facility-Administered Medications on File Prior to Visit  Medication Dose Route Frequency Provider Last Rate Last Dose  . methylPREDNISolone acetate (DEPO-MEDROL) injection 80 mg  80 mg Intramuscular Once Haley Covey, MD        Allergies  Allergen Reactions  . Denture Care Products (Sodium Bicarbonate)     Teeth yellowed, excessive sinus drainage  . Doxycycline     Sore throat, cough  . Olmesartan Medoxomil     Fainting spells (Benicar)    Family History  Problem Relation Age of Onset  . Arthritis Other   . Cancer Other     prostate, grandfather  . Diabetes Other     blood relative  . Kidney disease Other     brother PCKD      Objective:   Physical Exam BP 132/80  Pulse 62  Temp(Src) 98.4 F (36.9 C) (Oral)  Resp 10  Ht 5' 5.5" (1.664 m)  Wt 173 lb (78.472 kg)  BMI 28.34 kg/m2  SpO2 94% Weight: 173 lb (78.472 kg)  Wt Readings from Last 3 Encounters:  10/17/12 173 lb (78.472 kg)  06/19/12 175 lb (79.379 kg)  10/24/11 166 lb (75.297 kg)   Constitutional: overweight, in NAD Eyes: PERRLA, EOMI, no exophthalmos ENT: moist mucous membranes, no thyromegaly, no cervical lymphadenopathy Cardiovascular: RRR, No MRG Respiratory: CTA B Gastrointestinal: abdomen soft, NT, ND, BS+ Musculoskeletal: no deformities, strength intact in all 4 Skin: moist, warm, no rashes Neurological: no tremor with outstretched hands, DTR normal in all 4  Assessment:     1. Hypothyroidism  - s/p RAI ablation 02/2000 - s/p total thyroidectomy 07/2000 - on generic Levothyroxine 125 mcg    Plan:     Pt with long standing hypothyroidism, previosly well controlled, now with overreplacement with Levothyroxine.  - We discussed about her previous h/o hyperthyroidism and the fact that her previous RAI treatment affected the results of the thyroid pathology >> epithelial nonspecific atypia.  - she now has vague sxs that can be  attributed to both thyroid dysfunction and menopause - last TSH was slightly low, so we decided to recheck TSH and free T4 and see if she needs adjustment in Levothyroxine dose - we discussed proper intake of Levothyroxine, >30 min before b'fast, separated by >4h from antacids, calcium, iron, MVI - I will see her back in 6 mo, but might call her back in 2 months for labs if need to change her Levothyroxine dose - pt agrees to plan    Office Visit on 10/17/2012  Component Date Value Range Status  . TSH 10/17/2012 0.17* 0.35 - 5.50 uIU/mL Final  . Free T4 10/17/2012 1.50  0.60 - 1.60 ng/dL Final   Decrease Levothyroxine to 112 mcg.

## 2012-10-18 MED ORDER — LEVOTHYROXINE SODIUM 112 MCG PO TABS: 112.0000 ug | ORAL_TABLET | Freq: Every day | ORAL | Status: DC

## 2012-12-25 ENCOUNTER — Telehealth: Payer: Self-pay | Admitting: Family Medicine

## 2012-12-25 NOTE — Telephone Encounter (Signed)
Pt aware doxy is the abx listed on her allergy list

## 2012-12-25 NOTE — Telephone Encounter (Signed)
Pt is calling because she is having a dental procedure tomorrow. The doctor requested to find out which medications she is allergic to. Please advise.

## 2013-01-02 ENCOUNTER — Other Ambulatory Visit (INDEPENDENT_AMBULATORY_CARE_PROVIDER_SITE_OTHER): Payer: BC Managed Care – PPO

## 2013-01-02 DIAGNOSIS — E032 Hypothyroidism due to medicaments and other exogenous substances: Secondary | ICD-10-CM

## 2013-01-02 LAB — TSH: TSH: 3.14 u[IU]/mL (ref 0.35–5.50)

## 2013-01-07 ENCOUNTER — Telehealth: Payer: Self-pay | Admitting: Family Medicine

## 2013-01-07 NOTE — Telephone Encounter (Signed)
Patient calling into Triage line with concern of "blood pressure medication causing dizziness"/ Attempted call back . No answer. Left message on [phone]  to please return call for assistance.  No contact. Message left

## 2013-01-08 ENCOUNTER — Encounter: Payer: Self-pay | Admitting: Internal Medicine

## 2013-01-08 NOTE — Telephone Encounter (Signed)
Left detailed message on pt VM per Dr. Caryl Never message

## 2013-01-08 NOTE — Telephone Encounter (Signed)
Patient stated that she has been off of her blood pressure medication for a week now, and she is not having any dizzy spells. She took her blood pressure 2 days ago and it was 130/79. Pt wants to know do you want her to get back on another blood pressure medication.

## 2013-01-08 NOTE — Telephone Encounter (Signed)
i would continue to monitor OFF BP medication for now and be in touch if consistently over 140/90

## 2013-01-08 NOTE — Telephone Encounter (Signed)
Pt states the bp med causes dizziness. Pt has lost weight and is much more active now. Her BP has been fine. Pt has been taking bp med sporadically.  Pt went to endo and her thyroid was  .16  The MD (gherghe) changed her thyroid med and now is doing great. Pt not sure what you want to change her BP med to. Offered pt an appt, but she requested to send message first. pls advise.

## 2013-02-13 ENCOUNTER — Other Ambulatory Visit: Payer: Self-pay

## 2013-04-21 ENCOUNTER — Ambulatory Visit (INDEPENDENT_AMBULATORY_CARE_PROVIDER_SITE_OTHER): Payer: BC Managed Care – PPO | Admitting: Internal Medicine

## 2013-04-21 ENCOUNTER — Encounter: Payer: Self-pay | Admitting: Internal Medicine

## 2013-04-21 VITALS — BP 112/72 | HR 61 | Temp 98.0°F | Resp 12 | Wt 163.9 lb

## 2013-04-21 DIAGNOSIS — E039 Hypothyroidism, unspecified: Secondary | ICD-10-CM

## 2013-04-21 LAB — TSH: TSH: 33.97 u[IU]/mL — AB (ref 0.35–5.50)

## 2013-04-21 LAB — T3, FREE: T3, Free: 1.5 pg/mL — ABNORMAL LOW (ref 2.3–4.2)

## 2013-04-21 LAB — T4, FREE: FREE T4: 0.26 ng/dL — AB (ref 0.60–1.60)

## 2013-04-21 NOTE — Patient Instructions (Signed)
Please stop at the lab.  I will send you the results ASAP. Please come back for a follow-up appointment in 3 months

## 2013-04-21 NOTE — Progress Notes (Signed)
Subjective:     Patient ID: Haley Montgomery, female   DOB: Oct 09, 1955, 58 y.o.   MRN: [id number]  HPI [name] is a 58 y.o. woman of hypothyroidism. Last visit with me 6 mo ago.  She tells me she did not continue the Levothyroxine 112 after our last visit b/c: - weight gain up to >> 187 lbs - exhaustion - hypersomnolence  After she stopped the levothyroxine: - heartburn stopped - no more hyperthermia - lost weight >> 159 lbs - gained appetite - no need for BP meds anymore - shooting pain in knees at night resolved - dry skin  - watering eyes - swollen eyes  Reviewed hx:  Pt was dx with a "thyroid problem" (likely hyperthyroidism) in 2001 - she describes paroxysms of hot flushes, blurry vision, anxiety, dizziness, fainting. She also had a very large goiter at that time. 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 She was treated with RAI ablation in 02/16/2000. She felt extremely poorly soon after the treatment and saw Dr. Gerrit Friends, who recommended surgery to remove the goiter. She had total thyroidectomy on 08/06/2000 - pathology: TOTAL THYROIDECTOMY: THYROID GLAND WITH FIBROSIS, FOCAL CHRONIC INFLAMMATION AND NONSPECIFIC EPITHELIAL ATYPIA, NO TUMOR IDENTIFIED. Pt started Levothyroxine 125 mcg daily after the surgery. Dose was not hanged (since 2009).  At last visit, we decreased the Levothyroxine dose to 112 mcg.  Reviewed previous thyroid tests: Lab Results  Component Value Date   TSH 0.34* 06/19/2012   TSH 2.17 06/17/2010   TSH 2.18 05/14/2009   TSH 1.34 09/14/2008   Also has HTN, HL, GERD.  Review of Systems Constitutional: + weight gain/loss, + increased appetite, no fatigue, no more subjective hyperthermia Eyes: no blurry vision, no xerophthalmia ENT: no sore throat, no nodules palpated in throat, no dysphagia/odynophagia, no hoarseness Cardiovascular: no CP/SOB/palpitations/leg swelling Respiratory: no cough/SOB Gastrointestinal: no N/V/D/C,  no GERD Musculoskeletal: no muscle/no  joint aches Skin: no rashes Neurological: no tremors/numbness/tingling/dizziness  I reviewed pt's medications, allergies, PMH, social hx, family hx and no changes required, except as mentioned above.  Objective:   Physical Exam BP 112/72  Pulse 61  Temp(Src) 98 F (36.7 C) (Oral)  Resp 12  Wt 163 lb 14.4 oz (74.345 kg)  SpO2 95% Weight: 163 lb 14.4 oz (74.345 kg)  Wt Readings from Last 3 Encounters:  04/21/13 163 lb 14.4 oz (74.345 kg)  10/17/12 173 lb (78.472 kg)  06/19/12 175 lb (79.379 kg)   Constitutional: overweight, in NAD Eyes: PERRLA, EOMI, no exophthalmos ENT: moist mucous membranes, no thyromegaly, no cervical lymphadenopathy Cardiovascular: RRR, No MRG Respiratory: CTA B Gastrointestinal: abdomen soft, NT, ND, BS+ Musculoskeletal: no deformities, strength intact in all 4 Skin: moist, warm, no rashes Neurological: no tremor with outstretched hands, DTR normal in all 4  Assessment:     1. Hypothyroidism  - s/p RAI ablation 02/2000 - s/p total thyroidectomy 07/2000 - on generic Levothyroxine 125 mcg - but now off for 6 mo    Plan:     Pt with long standing hypothyroidism, previosly well controlled, now off Levothyroxine.  It is worrisome that pt stopped her levothyroxine completely, and it is very surprising that she feels well while off the hormone... Before this she had vague sxs that can be attributed to both thyroid dysfunction and menopause. Now she feels better and she lost weight, but she has a hoarse voice and periocular edema, and I suspect her TSH to be high. - we discussed about possible  consequences of being off thyroid hh's, to include dementia, HTN, weight gain, CHF, pericarditis, etc. - if TSH high, will check her for adrenal insufficiency, which is one reason why pts may feel worse when starting Thyroid hh than before, regardless of the pre-tx TSH, however, if TSH high (as I suspect), will absolutely need to  start Levothyroxine back. - will check TSH, fT3 and fT4 today - I will see her back in 3 mo, but might call her back in 4-6 weeks for labs - pt agrees to plan   Office Visit on 04/21/2013  Component Date Value Range Status  . TSH 04/21/2013 33.97* 0.35 - 5.50 uIU/mL Final  . Free T4 04/21/2013 0.26* 0.60 - 1.60 ng/dL Final  . T3, Free 84/13/244001/03/2014 1.5* 2.3 - 4.2 pg/mL Final   Needs thyroid hh. Will call her back at 8 am for a cortisol and ACTH first.  Msg sent: Dear Ms Haley Montgomery, Your cortisol is normal (ACTH is also a little low, but in the context of a normal cortisol, this is not usually a problem). However, since the cortisol level is <10, let's go ahead and check a cosyntropin stimulation test. Please come to the lab at 8 am and plan to be here for 1h - we will check labs at 8 am, 8:30 and 9 am. Please let me know when you can come to let Carollee HerterShannon know for the cosyntropin injection. Sincerely, Carlus Pavlovristina Jahmil Macleod MD  Component     Latest Ref Rng 04/28/2013 04/28/2013 04/28/2013         9:22 AM  9:26 AM  9:30 AM  Cortisol, Plasma      11.9 23.3 29.5  No adrenal insufficiency >> we need to start Levothyroxine.

## 2013-04-23 ENCOUNTER — Other Ambulatory Visit (INDEPENDENT_AMBULATORY_CARE_PROVIDER_SITE_OTHER): Payer: BC Managed Care – PPO

## 2013-04-23 DIAGNOSIS — E039 Hypothyroidism, unspecified: Secondary | ICD-10-CM

## 2013-04-23 LAB — CORTISOL: CORTISOL PLASMA: 9 ug/dL

## 2013-04-24 LAB — ACTH: C206 ACTH: 6 pg/mL — ABNORMAL LOW (ref 10–46)

## 2013-04-28 ENCOUNTER — Other Ambulatory Visit: Payer: Self-pay | Admitting: Internal Medicine

## 2013-04-28 ENCOUNTER — Other Ambulatory Visit: Payer: BC Managed Care – PPO

## 2013-04-28 ENCOUNTER — Other Ambulatory Visit (INDEPENDENT_AMBULATORY_CARE_PROVIDER_SITE_OTHER): Payer: BC Managed Care – PPO | Admitting: *Deleted

## 2013-04-28 DIAGNOSIS — E039 Hypothyroidism, unspecified: Secondary | ICD-10-CM

## 2013-04-28 LAB — CORTISOL
Cortisol, Plasma: 11.9 ug/dL
Cortisol, Plasma: 23.3 ug/dL
Cortisol, Plasma: 29.5 ug/dL

## 2013-04-28 MED ORDER — COSYNTROPIN 0.25 MG IJ SOLR
0.2500 mg | Freq: Once | INTRAMUSCULAR | Status: AC
  Administered 2013-04-28: 0.25 mg via INTRAMUSCULAR

## 2013-04-29 ENCOUNTER — Encounter: Payer: Self-pay | Admitting: Internal Medicine

## 2013-04-30 ENCOUNTER — Telehealth: Payer: Self-pay | Admitting: *Deleted

## 2013-04-30 NOTE — Telephone Encounter (Signed)
Pt called and lvm stating that she is confused by the results and wanted to know if Dr Elvera LennoxGherghe would change the brand of thyroid medication? Please advise.

## 2013-05-01 ENCOUNTER — Other Ambulatory Visit: Payer: Self-pay | Admitting: Internal Medicine

## 2013-05-01 DIAGNOSIS — E039 Hypothyroidism, unspecified: Secondary | ICD-10-CM

## 2013-05-01 MED ORDER — SYNTHROID 75 MCG PO TABS: 75.0000 ug | ORAL_TABLET | Freq: Every day | ORAL | Status: DC

## 2013-06-30 ENCOUNTER — Other Ambulatory Visit (INDEPENDENT_AMBULATORY_CARE_PROVIDER_SITE_OTHER): Payer: BC Managed Care – PPO

## 2013-06-30 ENCOUNTER — Other Ambulatory Visit: Payer: Self-pay | Admitting: Internal Medicine

## 2013-06-30 DIAGNOSIS — E039 Hypothyroidism, unspecified: Secondary | ICD-10-CM

## 2013-06-30 LAB — T4, FREE: FREE T4: 0.66 ng/dL (ref 0.60–1.60)

## 2013-06-30 LAB — TSH: TSH: 6.56 u[IU]/mL — AB (ref 0.35–5.50)

## 2013-06-30 MED ORDER — SYNTHROID 88 MCG PO TABS: 88.0000 ug | ORAL_TABLET | Freq: Every day | ORAL | Status: DC

## 2013-07-22 ENCOUNTER — Ambulatory Visit (INDEPENDENT_AMBULATORY_CARE_PROVIDER_SITE_OTHER): Payer: BC Managed Care – PPO | Admitting: Internal Medicine

## 2013-07-22 ENCOUNTER — Encounter: Payer: Self-pay | Admitting: Internal Medicine

## 2013-07-22 VITALS — BP 124/78 | HR 60 | Temp 98.4°F | Resp 12 | Wt 160.0 lb

## 2013-07-22 DIAGNOSIS — E039 Hypothyroidism, unspecified: Secondary | ICD-10-CM

## 2013-07-22 NOTE — Progress Notes (Signed)
Subjective:     Patient ID: Haley HammingKaren V Montgomery, female   DOB: 09-Aug-1955, 58 y.o.   MRN: [id number]  HPI Ms Haley Montgomery is a 58 y.o. woman of hypothyroidism. Last visit 3 mo ago.  Before last visit,  she stopped the Levothyroxine 112 b/c she was feeling poorly, and we restarted it after 6 mo >> lower dose: 75 mcg. On 06/30/2013 >> we increased the LT4 from 75 to 88 mcg and switched to brand name. She feels much better on this and lost 30 lbs.  Reviewed hx:  Pt was dx with a "thyroid problem" (likely hyperthyroidism) in 2001 - she describes paroxysms of hot flushes, blurry vision, anxiety, dizziness, fainting. She also had a very large goiter at that time. 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 She was treated with RAI ablation in 02/16/2000. She felt extremely poorly soon after the treatment and saw Dr. Gerrit FriendsGerkin, who recommended surgery to remove the goiter. She had total thyroidectomy on 08/06/2000 - pathology: TOTAL THYROIDECTOMY: THYROID GLAND WITH FIBROSIS, FOCAL CHRONIC INFLAMMATION AND NONSPECIFIC EPITHELIAL ATYPIA, NO TUMOR IDENTIFIED. Pt started Levothyroxine 125 mcg daily after the surgery. Dose was not hanged (since 2009).  Reviewed previous thyroid tests: Lab Results  Component Value Date   TSH 6.56* 06/30/2013   TSH 33.97* 04/21/2013   TSH 3.14 01/02/2013   TSH 0.17* 10/17/2012   TSH 0.34* 06/19/2012   FREET4 0.66 06/30/2013   FREET4 0.26* 04/21/2013   FREET4 0.81 01/02/2013   FREET4 1.50 10/17/2012   Also has HTN, HL, GERD. She is off Crestor and Losartan.  Review of Systems Constitutional: + weight loss, no fatigue, no more subjective hyperthermia Eyes: no blurry vision, no xerophthalmia ENT: no sore throat, no nodules palpated in throat, no dysphagia/odynophagia, no hoarseness Cardiovascular: no CP/SOB/palpitations/leg swelling Respiratory: no cough/SOB Gastrointestinal: no N/V/D/C, no GERD Musculoskeletal: no muscle/no  joint aches Skin: no  rashes Neurological: no tremors/numbness/tingling/dizziness  I reviewed pt's medications, allergies, PMH, social hx, family hx and no changes required, except as mentioned above.  Objective:   Physical Exam BP 124/78  Pulse 60  Temp(Src) 98.4 F (36.9 C) (Oral)  Resp 12  Wt 160 lb (72.576 kg)  SpO2 95%    Wt Readings from Last 3 Encounters:  07/22/13 160 lb (72.576 kg)  04/21/13 163 lb 14.4 oz (74.345 kg)  10/17/12 173 lb (78.472 kg)   Constitutional: overweight, in NAD Eyes: PERRLA, EOMI, no exophthalmos ENT: moist mucous membranes, no thyromegaly, no cervical lymphadenopathy Cardiovascular: RRR, No MRG Respiratory: CTA B Gastrointestinal: abdomen soft, NT, ND, BS+ Musculoskeletal: no deformities, strength intact in all 4 Skin: moist, warm, no rashes Neurological: no tremor with outstretched hands, DTR normal in all 4  Assessment:     1. Hypothyroidism  - s/p RAI ablation 02/2000 - s/p total thyroidectomy 07/2000 - on generic Levothyroxine 125 mcg - then off for 6 mo! >> restarted at 75 mcg >> increased to 88 mcg (brand name) Component     Latest Ref Rng 04/28/2013 04/28/2013 04/28/2013         9:22 AM  9:26 AM  9:30 AM  Cortisol, Plasma      11.9 23.3 29.5  No adrenal insufficiency >> we need to start Levothyroxine.     Plan:     Pt with long standing hypothyroidism, now feeling better on brand name Synthroid 88 mcg. - we reviewed her TFTs and cortisol stim test together  - we increased her Synthroid dose 3 weeks ago:  Lab Results  Component Value Date   TSH 6.56* 06/30/2013   FREET4 0.66 06/30/2013  -  will recheck TFTs in 1 mo - I will see her back in 1 year, but RTC for labs in 1 mo

## 2013-07-22 NOTE — Patient Instructions (Signed)
Please return in 1 year. Please come to the lab in 1 mo.

## 2013-08-21 ENCOUNTER — Other Ambulatory Visit (INDEPENDENT_AMBULATORY_CARE_PROVIDER_SITE_OTHER): Payer: BC Managed Care – PPO

## 2013-08-21 DIAGNOSIS — E039 Hypothyroidism, unspecified: Secondary | ICD-10-CM

## 2013-08-21 LAB — TSH: TSH: 3.45 u[IU]/mL (ref 0.35–4.50)

## 2013-08-21 LAB — T4, FREE: FREE T4: 0.88 ng/dL (ref 0.60–1.60)

## 2013-08-21 LAB — CORTISOL: Cortisol, Plasma: 8.3 ug/dL

## 2014-01-19 ENCOUNTER — Other Ambulatory Visit: Payer: BC Managed Care – PPO

## 2014-01-22 ENCOUNTER — Other Ambulatory Visit: Payer: BC Managed Care – PPO

## 2014-01-23 ENCOUNTER — Other Ambulatory Visit: Payer: Self-pay

## 2014-02-22 ENCOUNTER — Other Ambulatory Visit: Payer: Self-pay | Admitting: Internal Medicine

## 2014-09-04 ENCOUNTER — Ambulatory Visit (INDEPENDENT_AMBULATORY_CARE_PROVIDER_SITE_OTHER): Payer: BLUE CROSS/BLUE SHIELD | Admitting: Internal Medicine

## 2014-09-04 ENCOUNTER — Encounter: Payer: Self-pay | Admitting: Internal Medicine

## 2014-09-04 VITALS — BP 110/62 | HR 60 | Temp 97.5°F | Resp 14 | Wt 162.4 lb

## 2014-09-04 DIAGNOSIS — E890 Postprocedural hypothyroidism: Secondary | ICD-10-CM | POA: Diagnosis not present

## 2014-09-04 LAB — TSH: TSH: 2.71 u[IU]/mL (ref 0.35–4.50)

## 2014-09-04 LAB — T4, FREE: Free T4: 1.08 ng/dL (ref 0.60–1.60)

## 2014-09-04 NOTE — Progress Notes (Signed)
Subjective:     Patient ID: Haley Montgomery, female   DOB: Oct 20, 1955, 59 y.o.   MRN: [id number]  HPI Ms Haley Montgomery is a 59 y.o. woman of hypothyroidism. Last visit 1 year ago.  Reviewed hx:  Pt was dx with a "thyroid problem" (likely hyperthyroidism) in 2001 - she describes paroxysms of hot flushes, blurry vision, anxiety, dizziness, fainting. She also had a very large goiter at that time. 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   She was treated with RAI ablation in 02/16/2000. She felt extremely poorly soon after the treatment and saw Dr. Gerrit FriendsGerkin, who recommended surgery to remove the goiter.   She had total thyroidectomy on 08/06/2000 - pathology: TOTAL THYROIDECTOMY: THYROID GLAND WITH FIBROSIS, FOCAL CHRONIC INFLAMMATION AND NONSPECIFIC EPITHELIAL ATYPIA, NO TUMOR IDENTIFIED. Pt started Levothyroxine 125 mcg daily after the surgery.   Last year, she stopped the Levothyroxine 112 b/c she was feeling poorly, and we restarted it after 6 mo >> lower dose: 75 mcg. On 06/30/2013 >> we increased the LT4 from 75 to 88 mcg and switched to brand name. She feels much better on this.  She takes the Synthroid DAW: - in am - fasting - with water - eats >30 min later - no PPI - no Ca, iron - no MVI  Reviewed previous thyroid tests: Lab Results  Component Value Date   TSH 3.45 08/21/2013   TSH 6.56* 06/30/2013   TSH 33.97* 04/21/2013   TSH 3.14 01/02/2013   TSH 0.17* 10/17/2012   FREET4 0.88 08/21/2013   FREET4 0.66 06/30/2013   FREET4 0.26* 04/21/2013   FREET4 0.81 01/02/2013   FREET4 1.50 10/17/2012   She feels: - + fatigue, falls asleep easily - + weight gain: 7 lbs in 3 mo - + heat intolerance - + dry skin - no constipation - no hair loss  Also has HTN, HL, GERD. She is off Crestor and Losartan.  Review of Systems Constitutional: + weight gain, + fatigue, + subjective hyperthermia Eyes: no blurry vision, no xerophthalmia ENT: no sore  throat, no nodules palpated in throat, no dysphagia/odynophagia, no hoarseness Cardiovascular: no CP/SOB/palpitations/leg swelling Respiratory: no cough/SOB Gastrointestinal: no N/V/D/CGERD Musculoskeletal: no muscle/no  joint aches Skin: no rashes Neurological: no tremors/numbness/tingling/dizziness  I reviewed pt's medications, allergies, PMH, social hx, family hx, and changes were documented in the history of present illness. Otherwise, unchanged from my initial visit note.  Objective:   Physical Exam BP 110/62 mmHg  Pulse 60  Temp(Src) 97.5 F (36.4 C) (Oral)  Resp 14  Wt 162 lb 6.4 oz (73.664 kg)  SpO2 99% Weight: 162 lb 6.4 oz (73.664 kg)  Wt Readings from Last 3 Encounters:  09/04/14 162 lb 6.4 oz (73.664 kg)  07/22/13 160 lb (72.576 kg)  04/21/13 163 lb 14.4 oz (74.345 kg)   Constitutional: overweight, in NAD Eyes: PERRLA, EOMI, no exophthalmos ENT: moist mucous membranes, no neck masses, no cervical lymphadenopathy Cardiovascular: RRR, No MRG Respiratory: CTA B Gastrointestinal: abdomen soft, NT, ND, BS+ Musculoskeletal: no deformities, strength intact in all 4 Skin: moist, warm, no rashes Neurological: no tremor with outstretched hands, DTR normal in all 4  Assessment:     1. Hypothyroidism  - s/p RAI ablation 02/2000 - s/p total thyroidectomy 07/2000 - on generic Levothyroxine 125 mcg - then off for 6 mo! >> restarted at 75 mcg >> increased to 88 mcg (brand name) Component     Latest Ref Rng 04/28/2013 04/28/2013 04/28/2013  9:22 AM  9:26 AM  9:30 AM  Cortisol, Plasma      11.9 23.3 29.5  No adrenal insufficiency.     Plan:     Pt with long standing hypothyroidism, initially feeling better on brand name Synthroid 88 mcg, now again experiencing fatigue, heat intolerance and weight gain - we reviewed her latest TFTs (normal): Lab Results  Component Value Date   TSH 3.45 08/21/2013   FREET4 0.88 08/21/2013  - will recheck TFTs today - continue  Synthroid DAW 88 mcg for now - I will see her back in 1 year, but RTC for labs sooner if TFTs are abnormal  Office Visit on 09/04/2014  Component Date Value Ref Range Status  . TSH 09/04/2014 2.71  0.35 - 4.50 uIU/mL Final  . Free T4 09/04/2014 1.08  0.60 - 1.60 ng/dL Final   The thyroid labs are normal.

## 2014-09-04 NOTE — Patient Instructions (Signed)
Please stop at the lab.  Please return in 1 year.  

## 2014-09-07 ENCOUNTER — Other Ambulatory Visit: Payer: Self-pay | Admitting: Internal Medicine

## 2014-09-09 ENCOUNTER — Other Ambulatory Visit: Payer: Self-pay | Admitting: *Deleted

## 2014-09-09 MED ORDER — SYNTHROID 88 MCG PO TABS: ORAL_TABLET | ORAL | Status: DC

## 2014-11-03 ENCOUNTER — Other Ambulatory Visit: Payer: Self-pay | Admitting: *Deleted

## 2014-11-03 ENCOUNTER — Telehealth: Payer: Self-pay

## 2014-11-03 DIAGNOSIS — E039 Hypothyroidism, unspecified: Secondary | ICD-10-CM

## 2014-11-03 NOTE — Telephone Encounter (Signed)
So sorry to hear that!!!  The sxs are likely related to anxiety, but let's have her back for a TSH and fT4 to see if not hyperthyroid.

## 2014-11-03 NOTE — Telephone Encounter (Signed)
Please read message below and advise.  

## 2014-11-03 NOTE — Telephone Encounter (Signed)
Pt scheduled lab appt for next week.

## 2014-11-03 NOTE — Telephone Encounter (Signed)
Pt called about current Synthroid dosage. Pt is currently taking . Pt wanted to know if her dosage could be lowered. Pt stated a couple of weeks ago her husband passed away unexpectedly. Pt stated she missed taking her medication for a couple of days and actually felt better. Pt stated she was not as fatigued and she has lost 12 lbs. Pt would like to have her synthroid lowered one full dosage.  Please advise, Thanks!

## 2014-11-03 NOTE — Telephone Encounter (Signed)
Called pt and advised her per Dr Gherghe's message below.  

## 2014-11-11 ENCOUNTER — Other Ambulatory Visit (INDEPENDENT_AMBULATORY_CARE_PROVIDER_SITE_OTHER): Payer: BLUE CROSS/BLUE SHIELD

## 2014-11-11 DIAGNOSIS — E039 Hypothyroidism, unspecified: Secondary | ICD-10-CM

## 2014-11-11 LAB — TSH: TSH: 5.68 u[IU]/mL — ABNORMAL HIGH (ref 0.35–4.50)

## 2014-11-11 LAB — T4, FREE: Free T4: 0.91 ng/dL (ref 0.60–1.60)

## 2014-11-17 ENCOUNTER — Encounter: Payer: Self-pay | Admitting: *Deleted

## 2014-11-20 ENCOUNTER — Other Ambulatory Visit: Payer: Self-pay | Admitting: *Deleted

## 2014-11-20 MED ORDER — SYNTHROID 75 MCG PO TABS: 75.0000 ug | ORAL_TABLET | Freq: Every day | ORAL | Status: DC

## 2014-11-24 ENCOUNTER — Encounter: Payer: Self-pay | Admitting: Internal Medicine

## 2014-11-24 ENCOUNTER — Ambulatory Visit (INDEPENDENT_AMBULATORY_CARE_PROVIDER_SITE_OTHER): Payer: BLUE CROSS/BLUE SHIELD | Admitting: Internal Medicine

## 2014-11-24 VITALS — BP 124/64 | HR 59 | Temp 98.4°F | Resp 12 | Wt 157.0 lb

## 2014-11-24 DIAGNOSIS — E039 Hypothyroidism, unspecified: Secondary | ICD-10-CM

## 2014-11-24 MED ORDER — SYNTHROID 50 MCG PO TABS: 50.0000 ug | ORAL_TABLET | Freq: Every day | ORAL | Status: DC

## 2014-11-24 NOTE — Patient Instructions (Addendum)
Please start Synthroid 50 mcg daily.  Take the thyroid hormone every day, with water, >30 minutes before breakfast, separated by >4 hours from acid reflux medications, calcium, iron, multivitamins.  Please come back for labs in 5-6 weeks.

## 2014-11-24 NOTE — Progress Notes (Signed)
Subjective:     Patient ID: Haley Montgomery, female   DOB: 07-Dec-1955, 59 y.o.   MRN: [id number]  HPI Haley Montgomery is a 59 y.o. woman of hypothyroidism. Last visit 3 mo ago.  Reviewed hx:  Pt was dx with a "thyroid problem" (likely hyperthyroidism) in 2001 - she describes paroxysms of hot flushes, blurry vision, anxiety, dizziness, fainting. She also had a very large goiter at that time. 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   She was treated with RAI ablation in 02/16/2000. She felt extremely poorly soon after the treatment and saw Dr. Gerrit Friends, who recommended surgery to remove the goiter.   She had total thyroidectomy on 08/06/2000 - pathology: TOTAL THYROIDECTOMY: THYROID GLAND WITH FIBROSIS, FOCAL CHRONIC INFLAMMATION AND NONSPECIFIC EPITHELIAL ATYPIA, NO TUMOR IDENTIFIED. Pt started Levothyroxine 125 mcg daily after the surgery.   Last year, she stopped the Levothyroxine 112 b/c she was feeling poorly, and we restarted it after 6 mo >> lower dose: 75 mcg. On 06/30/2013 >> we increased the LT4 from 75 to 88 mcg and switched to brand name. She feels much better on this.  She takes the Synthroid DAW - but was off for 1 mo before last set of tests  (hot flushes, tachycardia, leg cramps, skin dry, feeling groggy, fatigue): - in am - fasting - with water - eats >30 min later - no PPI - no Ca, iron - no MVI  Reviewed previous thyroid tests: Lab Results  Component Value Date   TSH 5.68* 11/11/2014   TSH 2.71 09/04/2014   TSH 3.45 08/21/2013   TSH 6.56* 06/30/2013   TSH 33.97* 04/21/2013   FREET4 0.91 11/11/2014   FREET4 1.08 09/04/2014   FREET4 0.88 08/21/2013   FREET4 0.66 06/30/2013   FREET4 0.26* 04/21/2013   She feels much better after stopping LT4 (!) - no more fatigue, falls asleep easily - + weight loss - no more heat intolerance - no more dry skin - no constipation - no hair loss  Also has HTN, HL, GERD. She is off Crestor and  Losartan.  Review of Systems Constitutional: no weight gain, no  fatigue, no subjective hyperthermia Eyes: no blurry vision, no xerophthalmia ENT: no sore throat, no nodules palpated in throat, no dysphagia/odynophagia, no hoarseness Cardiovascular: no CP/SOB/palpitations/leg swelling Respiratory: no cough/SOB Gastrointestinal: no N/V/D/CGERD Musculoskeletal: no muscle/no  joint aches Skin: no rashes Neurological: no tremors/numbness/tingling/dizziness  I reviewed pt's medications, allergies, PMH, social hx, family hx, and changes were documented in the history of present illness. Otherwise, unchanged from my initial visit note.  Objective:   Physical Exam BP 124/64 mmHg  Pulse 59  Temp(Src) 98.4 F (36.9 C) (Oral)  Resp 12  Wt 157 lb (71.215 kg)  SpO2 98% Weight: 157 lb (71.215 kg)  Wt Readings from Last 3 Encounters:  11/24/14 157 lb (71.215 kg)  09/04/14 162 lb 6.4 oz (73.664 kg)  07/22/13 160 lb (72.576 kg)   Constitutional: overweight, in NAD Eyes: PERRLA, EOMI, no exophthalmos ENT: moist mucous membranes, no neck masses, no cervical lymphadenopathy Cardiovascular: RRR, No MRG Respiratory: CTA B Gastrointestinal: abdomen soft, NT, ND, BS+ Musculoskeletal: no deformities, strength intact in all 4 Skin: moist, warm, no rashes Neurological: no tremor with outstretched hands, DTR normal in all 4  Assessment:     1. Hypothyroidism  - s/p RAI ablation 02/2000 - s/p total thyroidectomy 07/2000 - on generic Levothyroxine 125 mcg - then off for 6 mo! >> restarted at 75  mcg >> increased to 88 mcg (brand name) Component     Latest Ref Rng 04/28/2013 04/28/2013 04/28/2013         9:22 AM  9:26 AM  9:30 AM  Cortisol, Plasma      11.9 23.3 29.5  No adrenal insufficiency.     Plan:     Pt with long standing hypothyroidism, now off her Synthroid 88 x 1 mo >> she feels much better after stopping LT4 and her skin is less dry (!) - we reviewed her latest TFTs earlier this mo  - TSH slightly high - she would like to keep the levels at the ULN or a little higher - will start Synthroid at a lower dose >> start 50 mcg daily - check TFTs in 5-6 weeks - if needs a higher dose, may need Tirosint - I will see her back in 6 mo, but RTC for labs sooner if TFTs are abnormal

## 2015-01-05 ENCOUNTER — Other Ambulatory Visit (INDEPENDENT_AMBULATORY_CARE_PROVIDER_SITE_OTHER): Payer: BLUE CROSS/BLUE SHIELD

## 2015-01-05 DIAGNOSIS — E039 Hypothyroidism, unspecified: Secondary | ICD-10-CM | POA: Diagnosis not present

## 2015-01-05 LAB — T4, FREE: Free T4: 0.58 ng/dL — ABNORMAL LOW (ref 0.60–1.60)

## 2015-01-05 LAB — TSH: TSH: 15.61 u[IU]/mL — ABNORMAL HIGH (ref 0.35–4.50)

## 2015-01-07 ENCOUNTER — Telehealth: Payer: Self-pay | Admitting: Internal Medicine

## 2015-01-07 MED ORDER — SYNTHROID 75 MCG PO TABS: 75.0000 ug | ORAL_TABLET | Freq: Every day | ORAL | Status: DC

## 2015-01-07 NOTE — Telephone Encounter (Signed)
Spoke with pt and she stated that she is taking her medication religiously. She wanted to know if she should take a different dose? Pt also wanted to know why her TSH and free T4 are different in ranges? Please advise.

## 2015-01-07 NOTE — Telephone Encounter (Signed)
Please advise her to increase the levothyroxine to 75 g daily and come back for a TSH and a free T4 in 6 weeks.

## 2015-01-07 NOTE — Telephone Encounter (Signed)
Patient stated that she is taking the levothyroxine.and she only has one pill left. what do she need to do next, please advise

## 2015-03-18 ENCOUNTER — Encounter: Payer: Self-pay | Admitting: Internal Medicine

## 2015-04-04 ENCOUNTER — Other Ambulatory Visit: Payer: Self-pay | Admitting: Internal Medicine

## 2015-04-07 ENCOUNTER — Other Ambulatory Visit: Payer: Self-pay | Admitting: Internal Medicine

## 2015-04-07 ENCOUNTER — Other Ambulatory Visit (INDEPENDENT_AMBULATORY_CARE_PROVIDER_SITE_OTHER): Payer: BLUE CROSS/BLUE SHIELD

## 2015-04-07 DIAGNOSIS — E038 Other specified hypothyroidism: Secondary | ICD-10-CM

## 2015-04-07 LAB — T4, FREE: Free T4: 0.8 ng/dL (ref 0.60–1.60)

## 2015-04-07 LAB — TSH: TSH: 10.64 u[IU]/mL — ABNORMAL HIGH (ref 0.35–4.50)

## 2015-04-16 ENCOUNTER — Other Ambulatory Visit: Payer: Self-pay | Admitting: *Deleted

## 2015-04-16 DIAGNOSIS — E039 Hypothyroidism, unspecified: Secondary | ICD-10-CM

## 2015-04-16 MED ORDER — SYNTHROID 100 MCG PO TABS: 100.0000 ug | ORAL_TABLET | Freq: Every day | ORAL | Status: DC

## 2015-04-21 ENCOUNTER — Telehealth: Payer: Self-pay | Admitting: *Deleted

## 2015-04-21 NOTE — Telephone Encounter (Signed)
Pt called stating that she has not taken her thyroid medication like she should since you increased it to the 100 mcg. Pt stated she needs to stay on the 75 mcg and get compliant before she has labs again. Be advised.

## 2015-04-30 ENCOUNTER — Encounter (HOSPITAL_COMMUNITY): Payer: Self-pay

## 2015-04-30 ENCOUNTER — Emergency Department (HOSPITAL_COMMUNITY)
Admission: EM | Admit: 2015-04-30 | Discharge: 2015-04-30 | Disposition: A | Discharge status: Home / Self Care | Payer: BLUE CROSS/BLUE SHIELD | Attending: Emergency Medicine | Admitting: Emergency Medicine

## 2015-04-30 ENCOUNTER — Emergency Department (HOSPITAL_COMMUNITY): Payer: BLUE CROSS/BLUE SHIELD

## 2015-04-30 DIAGNOSIS — W108XXA Fall (on) (from) other stairs and steps, initial encounter: Secondary | ICD-10-CM | POA: Diagnosis not present

## 2015-04-30 DIAGNOSIS — Y9289 Other specified places as the place of occurrence of the external cause: Secondary | ICD-10-CM | POA: Insufficient documentation

## 2015-04-30 DIAGNOSIS — Y998 Other external cause status: Secondary | ICD-10-CM | POA: Diagnosis not present

## 2015-04-30 DIAGNOSIS — S8992XA Unspecified injury of left lower leg, initial encounter: Secondary | ICD-10-CM | POA: Diagnosis not present

## 2015-04-30 DIAGNOSIS — M25552 Pain in left hip: Secondary | ICD-10-CM

## 2015-04-30 DIAGNOSIS — S79912A Unspecified injury of left hip, initial encounter: Secondary | ICD-10-CM | POA: Diagnosis not present

## 2015-04-30 DIAGNOSIS — Z87891 Personal history of nicotine dependence: Secondary | ICD-10-CM | POA: Insufficient documentation

## 2015-04-30 DIAGNOSIS — E039 Hypothyroidism, unspecified: Secondary | ICD-10-CM | POA: Diagnosis not present

## 2015-04-30 DIAGNOSIS — I10 Essential (primary) hypertension: Secondary | ICD-10-CM | POA: Diagnosis not present

## 2015-04-30 DIAGNOSIS — M25522 Pain in left elbow: Secondary | ICD-10-CM

## 2015-04-30 DIAGNOSIS — Y9389 Activity, other specified: Secondary | ICD-10-CM | POA: Insufficient documentation

## 2015-04-30 DIAGNOSIS — S59902A Unspecified injury of left elbow, initial encounter: Secondary | ICD-10-CM | POA: Insufficient documentation

## 2015-04-30 DIAGNOSIS — Z79899 Other long term (current) drug therapy: Secondary | ICD-10-CM | POA: Insufficient documentation

## 2015-04-30 MED ORDER — ACETAMINOPHEN 325 MG PO TABS
650.0000 mg | ORAL_TABLET | Freq: Once | ORAL | Status: AC
  Administered 2015-04-30: 650 mg via ORAL
  Filled 2015-04-30: qty 2

## 2015-04-30 MED ORDER — ACETAMINOPHEN 325 MG PO TABS: 650.0000 mg | ORAL_TABLET | Freq: Four times a day (QID) | ORAL | Status: DC | PRN

## 2015-04-30 MED ORDER — IBUPROFEN 800 MG PO TABS: 800.0000 mg | ORAL_TABLET | Freq: Three times a day (TID) | ORAL | Status: DC

## 2015-04-30 MED ORDER — IBUPROFEN 800 MG PO TABS
800.0000 mg | ORAL_TABLET | Freq: Once | ORAL | Status: AC
  Administered 2015-04-30: 800 mg via ORAL
  Filled 2015-04-30: qty 1

## 2015-04-30 NOTE — ED Notes (Signed)
Pt c/o L lower back pain, L elbow pain, and L hip pain after a slip and fall this morning.  Pain score 8/10.  Pt reports taking an Aleve w/o relief.  Pt reports that she slipped and fell down several steps.  No deformities noted.  Denies hitting head and LOC.

## 2015-04-30 NOTE — Discharge Instructions (Signed)
You were seen in the emergency room today for evaluation after a fall. Your x-rays were unremarkable with no evidence of fracture or dislocation. I will give you prescriptions for ibuprofen and acetaminophen. You may also use ice on and off for the next 24-48 hours. Please call your primary care provider to schedule a follow up appointment. Return to the ER for new or worsening symptoms.

## 2015-04-30 NOTE — ED Provider Notes (Signed)
CSN: [id number]     Arrival date & time 04/30/15  1425 History  By signing my name below, I, Tanda Rockers, attest that this documentation has been prepared under the direction and in the presence of Donat Humble, PA-C. Electronically Signed: Tanda Rockers, ED Scribe. 04/30/2015. 2:58 PM.   Chief Complaint  Patient presents with  . Fall  . Arm Pain  . Back Pain   The history is provided by the patient. No language interpreter was used.     HPI Comments: Haley Montgomery is a 60 y.o. female who presents to the Emergency Department complaining of sudden onset, constant, moderate, left elbow pain and left hip pain s/p fall that occurred earlier this morning. Pt states that she was wearing socks when she slipped going down the hardwood stairs, causing her to fall and bounce down 3 steps. No head injury or LOC. Pt reports hitting her elbow and hip while falling, causing the pain. Pt is able to ambulate but notes pain with walking. When she walks she has pain in her left knee but at rest there is no pain. Pt does not believe she hit her knee upon impact. Denies neck pain, headache, dizziness, nausea, vomiting, or any other associated symptoms.   Past Medical History  Diagnosis Date  . HYPOTHYROIDISM 09/14/2008  . HYPERLIPIDEMIA 09/14/2008  . ESSENTIAL HYPERTENSION 08/11/2009  . MRI, BRAIN, ABNORMAL 08/16/2009   Past Surgical History  Procedure Laterality Date  . Thyroidectomy  2002   Family History  Problem Relation Age of Onset  . Arthritis Other   . Cancer Other     prostate, grandfather  . Diabetes Other     blood relative  . Kidney disease Other     brother PCKD   Social History  Substance Use Topics  . Smoking status: Former Smoker -- 0.50 packs/day for 30 years    Types: Cigarettes    Quit date: 06/17/2002  . Smokeless tobacco: None  . Alcohol Use: No   OB History    No data available     Review of Systems  Gastrointestinal: Negative for nausea and vomiting.  Musculoskeletal:  Positive for arthralgias (Left elbow. Left hip. Left knee. ). Negative for gait problem and neck pain.  Neurological: Negative for dizziness, syncope and headaches.  All other systems reviewed and are negative.  Allergies  Denture care products; Doxycycline; and Olmesartan medoxomil  Home Medications   Prior to Admission medications   Medication Sig Start Date End Date Taking? Authorizing Provider  Histamine Dihydrochloride (AUSTRALIAN DREAM ARTHRITIS) 0.025 % CREA Apply 1 application topically daily as needed (joint pain).   Yes Historical Provider, MD  naproxen sodium (ANAPROX) 220 MG tablet Take 220 mg by mouth daily as needed (pain).   Yes Historical Provider, MD  SYNTHROID 75 MCG tablet Take 75 mcg by mouth daily. 04/06/15  Yes Historical Provider, MD  SYNTHROID 100 MCG tablet Take 1 tablet (100 mcg total) by mouth daily before breakfast. 04/16/15   Carlus Pavlov, MD   Triage Vitals:  BP 197/84 mmHg  Pulse 65  Temp(Src) 97.5 F (36.4 C) (Oral)  Resp 16  SpO2 100%   Physical Exam  Constitutional: She is oriented to person, place, and time. She appears well-developed and well-nourished. No distress.  HENT:  Head: Normocephalic and atraumatic.  Eyes: Conjunctivae and EOM are normal.  Neck: Neck supple. No tracheal deviation present.  Cardiovascular: Normal rate.   Pulmonary/Chest: Effort normal. No respiratory distress.  Musculoskeletal: Normal range of  motion. She exhibits tenderness.  Left anterior hip tender to palpation though FROM. Posterior left elbow tender to palpation, FROM. No edema. No discoloration.   Neurological: She is alert and oriented to person, place, and time.  Skin: Skin is warm and dry.  Psychiatric: She has a normal mood and affect. Her behavior is normal.  Nursing note and vitals reviewed.   ED Course  Procedures (including critical care time)  DIAGNOSTIC STUDIES: Oxygen Saturation is 100% on RA, normal by my interpretation.    COORDINATION  OF CARE: 2:57 PM-Discussed treatment plan which includes DG L Hip and DG L Elbow with pt at bedside and pt agreed to plan.   Labs Review Labs Reviewed - No data to display  Imaging Review Dg Elbow Complete Left  04/30/2015  CLINICAL DATA:  Larey Seat down a for steps at home today. Posterior left elbow pain. EXAM: LEFT ELBOW - COMPLETE 3+ VIEW COMPARISON:  None. FINDINGS: Soft tissue swelling is evident posterior to the elbow. There is no significant elbow effusion or underlying fracture. The joint is located. IMPRESSION: Soft tissue swelling posterior to the elbow without underlying fracture. Electronically Signed   By: Marin Roberts M.D.   On: 04/30/2015 15:28   Dg Hip Unilat With Pelvis 2-3 Views Left  04/30/2015  CLINICAL DATA:  Fall down steps with left hip pain, initial encounter EXAM: DG HIP (WITH OR WITHOUT PELVIS) 2-3V LEFT COMPARISON:  None. FINDINGS: Pelvic ring is intact. Sclerosis is noted at the pubic symphysis. No acute fracture or dislocation is seen. No soft tissue abnormality is noted. IMPRESSION: No acute abnormality noted. Electronically Signed   By: Alcide Clever M.D.   On: 04/30/2015 15:32   I have personally reviewed and evaluated these images as part of my medical decision-making.   EKG Interpretation None      MDM   Final diagnoses:  Fall (on) (from) other stairs and steps, initial encounter  Left elbow pain  Left hip pain    Given tenderness of elbow and hip will obtain x-rays of those two locations. No indication for head or neck imaging at this time. Pt would like to avoid narcotics. Will give ibuprofen and tylenol for pain. Anticipate D/C home with rx for same.   XR negative. Pt reports improvement in pain. D/c home as above. ER return precautions given.   I personally performed the services described in this documentation, which was scribed in my presence. The recorded information has been reviewed and is accurate.      Carlene Coria, PA-C 04/30/15  1628  Doug Sou, MD 04/30/15 [phone]

## 2015-05-27 ENCOUNTER — Ambulatory Visit: Payer: BLUE CROSS/BLUE SHIELD | Admitting: Internal Medicine

## 2015-06-03 ENCOUNTER — Encounter: Payer: Self-pay | Admitting: Internal Medicine

## 2015-06-03 ENCOUNTER — Ambulatory Visit (INDEPENDENT_AMBULATORY_CARE_PROVIDER_SITE_OTHER): Payer: BLUE CROSS/BLUE SHIELD | Admitting: Internal Medicine

## 2015-06-03 VITALS — BP 110/62 | HR 59 | Temp 98.0°F | Resp 12 | Wt 147.0 lb

## 2015-06-03 DIAGNOSIS — E039 Hypothyroidism, unspecified: Secondary | ICD-10-CM

## 2015-06-03 DIAGNOSIS — E890 Postprocedural hypothyroidism: Secondary | ICD-10-CM

## 2015-06-03 LAB — TSH: TSH: 4.97 u[IU]/mL — AB (ref 0.35–4.50)

## 2015-06-03 LAB — T4, FREE: Free T4: 0.96 ng/dL (ref 0.60–1.60)

## 2015-06-03 NOTE — Patient Instructions (Signed)
Please stop at the lab.  Please continue Levothyroxine 75 mcg daily.  Take the thyroid hormone every day, with water, at least 30 minutes before breakfast, separated by at least 4 hours from: - acid reflux medications - calcium - iron - multivitamins  Please come back for a follow-up appointment in 6 months. 

## 2015-06-03 NOTE — Progress Notes (Signed)
Subjective:     Patient ID: Haley Montgomery, female   DOB: 04-Apr-1956, 60 y.o.   MRN: [id number]  HPI Haley Montgomery is a 60 y.o. woman of hypothyroidism. Last visit 6 mo ago.  She tells me she feels MUCH better on this LT4 dose >> "I feel like myself again"!  Reviewed hx:  Pt was dx with a "thyroid problem" (likely hyperthyroidism) in 2001 - she describes paroxysms of hot flushes, blurry vision, anxiety, dizziness, fainting. She also had a very large goiter at that time. 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   She was treated with RAI ablation in 02/16/2000. She felt extremely poorly soon after the treatment and saw Dr. Gerrit Friends, who recommended surgery to remove the goiter.   She had total thyroidectomy on 08/06/2000 - pathology: TOTAL THYROIDECTOMY: THYROID GLAND WITH FIBROSIS, FOCAL CHRONIC INFLAMMATION AND NONSPECIFIC EPITHELIAL ATYPIA, NO TUMOR IDENTIFIED. Pt started Levothyroxine 125 mcg daily after the surgery.   Last year, she stopped the Levothyroxine 112 b/c she was feeling poorly, now on 75 mcg daily.  She takes the Synthroid DAW - not taking it consistently before last TSH check >> we continued the 75 mcg daily. She missed 1 week in 03/2015 (busy). - in am - fasting - with water - eats >30 min later - no PPI - no Ca, iron - no MVI  Reviewed previous thyroid tests: Lab Results  Component Value Date   TSH 10.64* 04/07/2015   TSH 15.61* 01/05/2015   TSH 5.68* 11/11/2014   TSH 2.71 09/04/2014   TSH 3.45 08/21/2013   FREET4 0.80 04/07/2015   FREET4 0.58* 01/05/2015   FREET4 0.91 11/11/2014   FREET4 1.08 09/04/2014   FREET4 0.88 08/21/2013   She c/o: - no more fatigue, falls asleep easily - no weight loss/gain - no more heat intolerance - no more dry skin - no constipation - no hair loss  Also has HTN, HL, GERD. She is off Crestor and Losartan.  Review of Systems Constitutional: no weight gain, no  fatigue, no subjective  hyperthermia Eyes: no blurry vision, no xerophthalmia ENT: no sore throat, no nodules palpated in throat, no dysphagia/odynophagia, no hoarseness Cardiovascular: no CP/SOB/palpitations/leg swelling Respiratory: no cough/SOB Gastrointestinal: no N/V/D/CGERD Musculoskeletal: no muscle/no  joint aches Skin: no rashes Neurological: no tremors/numbness/tingling/dizziness  I reviewed pt's medications, allergies, PMH, social hx, family hx, and changes were documented in the history of present illness. Otherwise, unchanged from my initial visit note.  Objective:   Physical Exam BP 110/62 mmHg  Pulse 59  Temp(Src) 98 F (36.7 C) (Oral)  Resp 12  Wt 147 lb (66.679 kg)  SpO2 96% Weight: 147 lb (66.679 kg)  Wt Readings from Last 3 Encounters:  06/03/15 147 lb (66.679 kg)  11/24/14 157 lb (71.215 kg)  09/04/14 162 lb 6.4 oz (73.664 kg)   Constitutional: overweight, in NAD Eyes: PERRLA, EOMI, no exophthalmos ENT: moist mucous membranes, no neck masses, no cervical lymphadenopathy Cardiovascular: RRR, No MRG Respiratory: CTA B Gastrointestinal: abdomen soft, NT, ND, BS+ Musculoskeletal: no deformities, strength intact in all 4 Skin: moist, warm, no rashes Neurological: no tremor with outstretched hands, DTR normal in all 4  Assessment:     1. Hypothyroidism  - s/p RAI ablation 02/2000 - s/p total thyroidectomy 07/2000 - on generic Levothyroxine 125 mcg - then off for 6 mo! >> restarted at 75 mcg >> increased to 88 mcg (brand name) Component     Latest Ref Rng 04/28/2013 04/28/2013  04/28/2013         9:22 AM  9:26 AM  9:30 AM  Cortisol, Plasma      11.9 23.3 29.5  No adrenal insufficiency.     Plan:     Pt with long standing hypothyroidism, now on Synthroid 75 x 1 mo >> she feels much better! Would like to stay on the same dose if possible! - we reviewed her latest TFTs >> TSH high, but she was skipping doses - she is now taking it daily (for the last 2 mo) - We discussed to  take the thyroid hormone every day, with water, at least 30 minutes before breakfast, separated by at least 4 hours from: - acid reflux medications - calcium - iron - multivitamins - she is taking it correctly - check TFTs today - I will see her back in 6 mo, but RTC for labs sooner if TFTs are abnormal   Needs LT4 refills.  Component     Latest Ref Rng 06/03/2015  TSH     0.35 - 4.50 uIU/mL 4.97 (H)  T4,Free(Direct)     0.60 - 1.60 ng/dL 4.09  TSH much improved, only minimally high right now, but, per her preference, will stay on the same dose of Synthroid. We'll recheck her thyroid tests at next visit.

## 2015-06-04 MED ORDER — SYNTHROID 75 MCG PO TABS: 75.0000 ug | ORAL_TABLET | Freq: Every day | ORAL | Status: DC

## 2015-07-04 ENCOUNTER — Other Ambulatory Visit: Payer: Self-pay | Admitting: Internal Medicine

## 2015-08-17 DIAGNOSIS — H1045 Other chronic allergic conjunctivitis: Secondary | ICD-10-CM | POA: Diagnosis not present

## 2015-09-07 ENCOUNTER — Ambulatory Visit: Payer: Self-pay | Admitting: Internal Medicine

## 2015-11-30 ENCOUNTER — Ambulatory Visit: Payer: BLUE CROSS/BLUE SHIELD | Admitting: Internal Medicine

## 2015-11-30 DIAGNOSIS — Z0289 Encounter for other administrative examinations: Secondary | ICD-10-CM

## 2015-12-16 ENCOUNTER — Other Ambulatory Visit: Payer: Self-pay | Admitting: Internal Medicine

## 2015-12-16 DIAGNOSIS — E890 Postprocedural hypothyroidism: Secondary | ICD-10-CM

## 2015-12-17 ENCOUNTER — Other Ambulatory Visit (INDEPENDENT_AMBULATORY_CARE_PROVIDER_SITE_OTHER): Payer: BLUE CROSS/BLUE SHIELD

## 2015-12-17 ENCOUNTER — Other Ambulatory Visit: Payer: Self-pay

## 2015-12-17 DIAGNOSIS — E890 Postprocedural hypothyroidism: Secondary | ICD-10-CM

## 2015-12-17 DIAGNOSIS — E039 Hypothyroidism, unspecified: Secondary | ICD-10-CM

## 2015-12-17 LAB — T4, FREE: FREE T4: 0.88 ng/dL (ref 0.60–1.60)

## 2015-12-17 LAB — TSH: TSH: 9.45 u[IU]/mL — ABNORMAL HIGH (ref 0.35–4.50)

## 2015-12-17 MED ORDER — LEVOTHYROXINE SODIUM 88 MCG PO TABS: 88.0000 ug | ORAL_TABLET | Freq: Every day | ORAL | 0 refills | Status: DC

## 2015-12-17 MED ORDER — SYNTHROID 88 MCG PO TABS: 88.0000 ug | ORAL_TABLET | Freq: Every day | ORAL | 0 refills | Status: DC

## 2015-12-17 NOTE — Telephone Encounter (Signed)
Called and gave patient lab results, and advised of new medication that was sent to pharmacy and scheduled patient for lab appointment.

## 2016-01-28 ENCOUNTER — Other Ambulatory Visit: Payer: BLUE CROSS/BLUE SHIELD

## 2016-03-22 ENCOUNTER — Encounter: Payer: Self-pay | Admitting: Internal Medicine

## 2016-03-22 ENCOUNTER — Ambulatory Visit (INDEPENDENT_AMBULATORY_CARE_PROVIDER_SITE_OTHER): Payer: BLUE CROSS/BLUE SHIELD | Admitting: Internal Medicine

## 2016-03-22 VITALS — BP 106/72 | HR 51 | Ht 65.5 in | Wt 144.2 lb

## 2016-03-22 DIAGNOSIS — E890 Postprocedural hypothyroidism: Secondary | ICD-10-CM | POA: Diagnosis not present

## 2016-03-22 LAB — TSH: TSH: 7.16 u[IU]/mL — AB (ref 0.35–4.50)

## 2016-03-22 LAB — T4, FREE: Free T4: 1.14 ng/dL (ref 0.60–1.60)

## 2016-03-22 NOTE — Progress Notes (Signed)
Subjective:     Patient ID: Haley Montgomery, female   DOB: [birth year], 60 y.o.   MRN: [id number]  HPI Ms Haley Montgomery is a 60 y.o. woman of hypothyroidism. Last visit 10 mo ago.  Reviewed hx:  Pt was dx with a "thyroid problem" (likely hyperthyroidism) in 2001 - she describes paroxysms of hot flushes, blurry vision, anxiety, dizziness, fainting. She also had a very large goiter at that time. 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   She was treated with RAI ablation in 02/16/2000. She felt extremely poorly soon after the treatment and saw Dr. Gerrit FriendsGerkin, who recommended surgery to remove the goiter.   She had total thyroidectomy on 08/06/2000 - pathology: TOTAL THYROIDECTOMY: THYROID GLAND WITH FIBROSIS, FOCAL CHRONIC INFLAMMATION AND NONSPECIFIC EPITHELIAL ATYPIA, NO TUMOR IDENTIFIED. Pt started Levothyroxine 125 mcg daily after the surgery, then decreased to 112 mcg daily..   Last year, she stopped the Levothyroxine 112 b/c she was feeling poorly, now on Synthroid DAW 88 mcg daily (increasd 12/2015).  She takes the Synthroid: - in am - fasting - with water - eats >30 min later - no PPI - no Ca, iron - no MVI  Reviewed previous thyroid tests: Lab Results  Component Value Date   TSH 9.45 (H) 12/17/2015   TSH 4.97 (H) 06/03/2015   TSH 10.64 (H) 04/07/2015   TSH 15.61 (H) 01/05/2015   TSH 5.68 (H) 11/11/2014   FREET4 0.88 12/17/2015   FREET4 0.96 06/03/2015   FREET4 0.80 04/07/2015   FREET4 0.58 (L) 01/05/2015   FREET4 0.91 11/11/2014   She mentions: - + weight loss - no more fatigue - no weight loss/gain - no more heat intolerance - no more dry skin - no constipation - no more hair loss  Also has HTN, HL, GERD. She is off Crestor and Losartan.  Her daughter was murdered. Her son recently embezzled her firm >> has to file for bankuptcy.  Review of Systems Constitutional: + see HPI Eyes: no blurry vision, no xerophthalmia ENT: no sore  throat, no nodules palpated in throat, no dysphagia/odynophagia, no hoarseness Cardiovascular: no CP/SOB/palpitations/leg swelling Respiratory: no cough/SOB Gastrointestinal: no N/V/D/C/GERD Musculoskeletal: no muscle/no  joint aches Skin: no rashes Neurological: no tremors/numbness/tingling/dizziness  I reviewed pt's medications, allergies, PMH, social hx, family hx, and changes were documented in the history of present illness. Otherwise, unchanged from my initial visit note.  Objective:   Physical Exam BP 106/72 (BP Location: Left Arm, Patient Position: Sitting, Cuff Size: Normal)   Pulse (!) 51   Ht 5' 5.5" (1.664 m)   Wt 144 lb 3.2 oz (65.4 kg)   SpO2 96%   BMI 23.63 kg/m     Wt Readings from Last 3 Encounters:  03/22/16 144 lb 3.2 oz (65.4 kg)  06/03/15 147 lb (66.7 kg)  11/24/14 157 lb (71.2 kg)   Constitutional: overweight, in NAD Eyes: PERRLA, EOMI, no exophthalmos ENT: moist mucous membranes, no neck masses, no cervical lymphadenopathy Cardiovascular: RRR, No MRG Respiratory: CTA B Gastrointestinal: abdomen soft, NT, ND, BS+ Musculoskeletal: no deformities, strength intact in all 4 Skin: moist, warm, no rashes Neurological: no tremor with outstretched hands, DTR normal in all 4  Assessment:     1. Hypothyroidism  - s/p RAI ablation 02/2000 - s/p total thyroidectomy 07/2000  Component     Latest Ref Rng 04/28/2013 04/28/2013 04/28/2013         9:22 AM  9:26 AM  9:30 AM  Cortisol, Plasma  11.9 23.3 29.5  No adrenal insufficiency.     Plan:     Pt with long standing hypothyroidism, now on Synthroid 88 mcg in last 3 mo >> she feels good. Would like to stay on the same dose if possible, she is reticent to increase the dose further. The calculated required LT4 for her would be ~100 mcg daily. - we reviewed her latest TFTs >> TSH high, >9 - she is now taking it daily - but missed few doses in last 3 months >> advised her to place the bottle on her  nightstand - We discussed to take the thyroid hormone every day, with water, at least 30 minutes before breakfast, separated by at least 4 hours from: - acid reflux medications - calcium - iron - multivitamins She is taking it correctly - check TFTs today - I will see her back in 6 mo, but RTC for labs sooner if TFTs are abnormal   Needs Synthroid refills.  Component     Latest Ref Rng & Units 03/22/2016  TSH     0.35 - 4.50 uIU/mL 7.16 (H)  T4,Free(Direct)     0.60 - 1.60 ng/dL 1.611.14   Were discussed with patient to increase the dose of LT4 to 100 g daily and repeat the test in 2 months.  Carlus Pavlovristina Shadie Sweatman, MD PhD [facility]

## 2016-03-22 NOTE — Patient Instructions (Signed)
Please stop at the lab.  Please continue Synthroid 88 mcg daily.  Take the thyroid hormone every day, with water, at least 30 minutes before breakfast, separated by at least 4 hours from: - acid reflux medications - calcium - iron - multivitamins  Please come back for a follow-up appointment in 6 months. 

## 2016-03-24 ENCOUNTER — Encounter: Payer: Self-pay | Admitting: Internal Medicine

## 2016-03-25 DIAGNOSIS — S61215A Laceration without foreign body of left ring finger without damage to nail, initial encounter: Secondary | ICD-10-CM | POA: Diagnosis not present

## 2016-03-25 DIAGNOSIS — S61412A Laceration without foreign body of left hand, initial encounter: Secondary | ICD-10-CM | POA: Diagnosis not present

## 2016-03-25 DIAGNOSIS — S61213A Laceration without foreign body of left middle finger without damage to nail, initial encounter: Secondary | ICD-10-CM | POA: Diagnosis not present

## 2016-03-25 DIAGNOSIS — Z23 Encounter for immunization: Secondary | ICD-10-CM | POA: Diagnosis not present

## 2016-03-25 DIAGNOSIS — E079 Disorder of thyroid, unspecified: Secondary | ICD-10-CM | POA: Diagnosis not present

## 2016-03-25 DIAGNOSIS — M19042 Primary osteoarthritis, left hand: Secondary | ICD-10-CM | POA: Diagnosis not present

## 2016-03-25 DIAGNOSIS — M1812 Unilateral primary osteoarthritis of first carpometacarpal joint, left hand: Secondary | ICD-10-CM | POA: Diagnosis not present

## 2016-03-27 ENCOUNTER — Other Ambulatory Visit: Payer: Self-pay | Admitting: Internal Medicine

## 2016-03-27 DIAGNOSIS — E890 Postprocedural hypothyroidism: Secondary | ICD-10-CM

## 2016-03-27 MED ORDER — SYNTHROID 100 MCG PO TABS: 100.0000 ug | ORAL_TABLET | Freq: Every day | ORAL | 1 refills | Status: DC

## 2016-03-27 NOTE — Telephone Encounter (Signed)
Patient need a refill of   SYNTHROID 100 MCG tablet  She do not have anymore medication, been out for three  days.  [facility] [id number] - [place], [place] - [address] [phone] (Phone) [phone] (Fax)

## 2016-04-01 DIAGNOSIS — Z4802 Encounter for removal of sutures: Secondary | ICD-10-CM | POA: Diagnosis not present

## 2016-04-04 DIAGNOSIS — Z4802 Encounter for removal of sutures: Secondary | ICD-10-CM | POA: Diagnosis not present

## 2016-04-04 DIAGNOSIS — I160 Hypertensive urgency: Secondary | ICD-10-CM | POA: Diagnosis not present

## 2016-06-11 DIAGNOSIS — I160 Hypertensive urgency: Secondary | ICD-10-CM | POA: Diagnosis not present

## 2016-06-11 DIAGNOSIS — N3001 Acute cystitis with hematuria: Secondary | ICD-10-CM | POA: Diagnosis not present

## 2016-06-11 DIAGNOSIS — R300 Dysuria: Secondary | ICD-10-CM | POA: Diagnosis not present

## 2016-07-05 DIAGNOSIS — R399 Unspecified symptoms and signs involving the genitourinary system: Secondary | ICD-10-CM | POA: Diagnosis not present

## 2016-07-05 DIAGNOSIS — R319 Hematuria, unspecified: Secondary | ICD-10-CM | POA: Diagnosis not present

## 2016-07-05 DIAGNOSIS — I160 Hypertensive urgency: Secondary | ICD-10-CM | POA: Diagnosis not present

## 2016-07-05 DIAGNOSIS — N390 Urinary tract infection, site not specified: Secondary | ICD-10-CM | POA: Diagnosis not present

## 2016-07-21 ENCOUNTER — Ambulatory Visit (INDEPENDENT_AMBULATORY_CARE_PROVIDER_SITE_OTHER): Payer: BLUE CROSS/BLUE SHIELD | Admitting: Physician Assistant

## 2016-07-21 ENCOUNTER — Encounter: Payer: Self-pay | Admitting: Physician Assistant

## 2016-07-21 ENCOUNTER — Other Ambulatory Visit: Payer: Self-pay | Admitting: Internal Medicine

## 2016-07-21 VITALS — BP 100/78 | HR 66 | Temp 98.3°F | Resp 14 | Ht 65.0 in | Wt 144.0 lb

## 2016-07-21 DIAGNOSIS — N390 Urinary tract infection, site not specified: Secondary | ICD-10-CM | POA: Diagnosis not present

## 2016-07-21 LAB — POCT URINALYSIS DIPSTICK
Bilirubin, UA: NEGATIVE
Glucose, UA: NEGATIVE
Ketones, UA: NEGATIVE
Leukocytes, UA: NEGATIVE
NITRITE UA: NEGATIVE
Protein, UA: NEGATIVE
RBC UA: NEGATIVE
SPEC GRAV UA: 1.015 (ref 1.010–1.025)
UROBILINOGEN UA: 0.2 U/dL
pH, UA: 6 (ref 5.0–8.0)

## 2016-07-21 NOTE — Progress Notes (Signed)
Pre visit review using our clinic review tool, if applicable. No additional management support is needed unless otherwise documented below in the visit note. 

## 2016-07-21 NOTE — Patient Instructions (Signed)
Stay well hydrated. Start a daily probiotic and cranberry supplement for urinary tract health. Your quick urine test in office is normal. I will send for a culture to ensure eradication of infection.  Please follow-up with Dr. Beverely Low as scheduled next week to formally establish care.

## 2016-07-21 NOTE — Progress Notes (Signed)
Patient presents to clinic today as a new patient for UC follow-up. Patient is formally establishing with Dr. Beverely Low next week.  Patient was seen at [facility] twice this past month for UTI. Was initially treated with Macrobid without resolution. On repeat assessment, she was started on Ciprofloxacin. Culture revealed E. Coli UTI. Patient endorses completing medication. Denies any residual symptoms. Is staying hydrated. Denies urinary urgency, frequency, hematuria, nausea/vomiting, flank pain, back pain, fever or chills.  Past Medical History:  Diagnosis Date  . ESSENTIAL HYPERTENSION 08/11/2009  . HYPERLIPIDEMIA 09/14/2008  . HYPOTHYROIDISM 09/14/2008  . MRI, BRAIN, ABNORMAL 08/16/2009    Current Outpatient Prescriptions on File Prior to Visit  Medication Sig Dispense Refill  . SYNTHROID 100 MCG tablet Take 1 tablet (100 mcg total) by mouth daily before breakfast. 60 tablet 1   No current facility-administered medications on file prior to visit.     Allergies  Allergen Reactions  . Doxycycline Anaphylaxis    Sore throat, cough  . Denture Care Products [Sodium Bicarbonate] Other (See Comments)    Teeth yellowed, excessive sinus drainage  . Olmesartan Medoxomil Other (See Comments)    Fainting spells (Benicar)    Family History  Problem Relation Age of Onset  . Arthritis Other   . Cancer Other     prostate, grandfather  . Diabetes Other     blood relative  . Kidney disease Other     brother PCKD    Social History   Social History  . Marital status: Married    Spouse name: N/A  . Number of children: N/A  . Years of education: N/A   Social History Main Topics  . Smoking status: Former Smoker    Packs/day: 0.50    Years: 30.00    Types: Cigarettes    Quit date: 06/17/2002  . Smokeless tobacco: Never Used  . Alcohol use Yes     Comment: glass of wine occasional  . Drug use: No  . Sexual activity: Not Asked   Other Topics Concern  . None   Social History Narrative  . None     Review of Systems  Constitutional: Negative for chills and fever.  Cardiovascular: Negative for chest pain.  Gastrointestinal: Negative for abdominal pain, constipation, diarrhea, nausea and vomiting.  Genitourinary: Negative for dysuria, flank pain, frequency, hematuria and urgency.    BP 100/78   Pulse 66   Temp 98.3 F (36.8 C) (Oral)   Resp 14   Ht  (1.651 m)   Wt 144 lb (65.3 kg)   SpO2 97%   BMI 23.96 kg/m   Physical Exam  Constitutional: She is oriented to person, place, and time and well-developed, well-nourished, and in no distress.  HENT:  Head: Normocephalic and atraumatic.  Eyes: Conjunctivae are normal.  Neck: Neck supple.  Cardiovascular: Normal rate, regular rhythm, normal heart sounds and intact distal pulses.   Pulmonary/Chest: Effort normal and breath sounds normal. No respiratory distress. She has no wheezes. She has no rales. She exhibits no tenderness.  Abdominal: Soft. Bowel sounds are normal. She exhibits no distension. There is no tenderness. There is no rebound and no guarding.  Negative CVA tenderness.  Lymphadenopathy:    She has no cervical adenopathy.  Neurological: She is alert and oriented to person, place, and time.  Skin: Skin is warm and dry. No rash noted.  Psychiatric: Affect normal.  Vitals reviewed.  Assessment/Plan: 1. Recurrent UTI Secondary to incomplete treatment. Was seen at outside UC. Has  completed course of Cipro with resolution of symptoms. She was told she needed repeat assessment to ensure resolution. Urine dip negative. Will send for culture. Discussed supportive measures to help prevent recurrence.  - POCT Urinalysis Dipstick - Urine culture   Piedad Climes, PA-C

## 2016-07-22 LAB — URINE CULTURE

## 2016-07-27 ENCOUNTER — Ambulatory Visit (INDEPENDENT_AMBULATORY_CARE_PROVIDER_SITE_OTHER): Payer: BLUE CROSS/BLUE SHIELD | Admitting: Family Medicine

## 2016-07-27 ENCOUNTER — Encounter: Payer: Self-pay | Admitting: Family Medicine

## 2016-07-27 VITALS — BP 106/80 | HR 60 | Temp 98.3°F | Resp 17 | Ht 65.0 in | Wt 142.1 lb

## 2016-07-27 DIAGNOSIS — H9192 Unspecified hearing loss, left ear: Secondary | ICD-10-CM

## 2016-07-27 DIAGNOSIS — E890 Postprocedural hypothyroidism: Secondary | ICD-10-CM | POA: Diagnosis not present

## 2016-07-27 DIAGNOSIS — Z1231 Encounter for screening mammogram for malignant neoplasm of breast: Secondary | ICD-10-CM | POA: Diagnosis not present

## 2016-07-27 DIAGNOSIS — Z0000 Encounter for general adult medical examination without abnormal findings: Secondary | ICD-10-CM | POA: Diagnosis not present

## 2016-07-27 DIAGNOSIS — Z1283 Encounter for screening for malignant neoplasm of skin: Secondary | ICD-10-CM | POA: Diagnosis not present

## 2016-07-27 LAB — HEPATIC FUNCTION PANEL
ALT: 8 U/L (ref 0–35)
AST: 14 U/L (ref 0–37)
Albumin: 4.1 g/dL (ref 3.5–5.2)
Alkaline Phosphatase: 87 U/L (ref 39–117)
Bilirubin, Direct: 0.1 mg/dL (ref 0.0–0.3)
TOTAL PROTEIN: 6.9 g/dL (ref 6.0–8.3)
Total Bilirubin: 0.4 mg/dL (ref 0.2–1.2)

## 2016-07-27 LAB — CBC WITH DIFFERENTIAL/PLATELET
Basophils Absolute: 0.1 10*3/uL (ref 0.0–0.1)
Basophils Relative: 1 % (ref 0.0–3.0)
EOS ABS: 0.1 10*3/uL (ref 0.0–0.7)
Eosinophils Relative: 1.4 % (ref 0.0–5.0)
HCT: 43.4 % (ref 36.0–46.0)
HEMOGLOBIN: 14.8 g/dL (ref 12.0–15.0)
LYMPHS ABS: 3.1 10*3/uL (ref 0.7–4.0)
Lymphocytes Relative: 39.1 % (ref 12.0–46.0)
MCHC: 34.1 g/dL (ref 30.0–36.0)
MCV: 93.7 fl (ref 78.0–100.0)
MONO ABS: 0.4 10*3/uL (ref 0.1–1.0)
Monocytes Relative: 5.1 % (ref 3.0–12.0)
NEUTROS ABS: 4.3 10*3/uL (ref 1.4–7.7)
NEUTROS PCT: 53.4 % (ref 43.0–77.0)
PLATELETS: 237 10*3/uL (ref 150.0–400.0)
RBC: 4.63 Mil/uL (ref 3.87–5.11)
RDW: 14.9 % (ref 11.5–15.5)
WBC: 8 10*3/uL (ref 4.0–10.5)

## 2016-07-27 LAB — BASIC METABOLIC PANEL
BUN: 12 mg/dL (ref 6–23)
CHLORIDE: 106 meq/L (ref 96–112)
CO2: 28 mEq/L (ref 19–32)
CREATININE: 0.96 mg/dL (ref 0.40–1.20)
Calcium: 9.5 mg/dL (ref 8.4–10.5)
GFR: 62.9 mL/min (ref >60.00)
Glucose, Bld: 78 mg/dL (ref 70–99)
POTASSIUM: 4.2 meq/L (ref 3.5–5.1)
Sodium: 139 mEq/L (ref 135–145)

## 2016-07-27 LAB — LIPID PANEL
CHOLESTEROL: 220 mg/dL — AB (ref 0–200)
HDL: 54.6 mg/dL (ref >39.00)
LDL CALC: 148 mg/dL — AB (ref 0–99)
NonHDL: 165.86
TRIGLYCERIDES: 90 mg/dL (ref 0.0–149.0)
Total CHOL/HDL Ratio: 4
VLDL: 18 mg/dL (ref 0.0–40.0)

## 2016-07-27 LAB — TSH: TSH: 4.94 u[IU]/mL — ABNORMAL HIGH (ref 0.35–4.50)

## 2016-07-27 NOTE — Assessment & Plan Note (Signed)
Chronic problem.  Check TSH.  Forward to Dr Elvera Lennox for review

## 2016-07-27 NOTE — Progress Notes (Signed)
Pre visit review using our clinic review tool, if applicable. No additional management support is needed unless otherwise documented below in the visit note. 

## 2016-07-27 NOTE — Assessment & Plan Note (Signed)
Pt's PE WNL.  UTD on colonoscopy.  Overdue for pap/mammo.  Order entered for mammo.  Pt to return for pap at a later date.  Check labs.  Anticipatory guidance provided.

## 2016-07-27 NOTE — Patient Instructions (Signed)
Schedule a pap at your convenience Follow up in 1 year or as needed We'll notify you of your lab results and make any changes if needed Continue to work on healthy diet and regular exercise- you can do it! We'll call you with your mammogram, Dermatology, and Audiology appts Call with any questions or concerns Welcome!  We're glad to have you!!

## 2016-07-27 NOTE — Progress Notes (Signed)
   Subjective:    Patient ID: Haley Montgomery, female    DOB: 23-Feb-1956, 61 y.o.   MRN: [id number]  HPI New to establish.  No recent PCP.  Endo- Gherghe  Health Maintenance- pt has not had pap/mammo in 5 years.  Used to see Dr Chevis Pretty.  UTD on Tdap.  UTD on colonoscopy.  No concerns today.   Review of Systems Patient reports no vision changes, adenopathy,fever, weight change,  persistant/recurrent hoarseness , swallowing issues, chest pain, palpitations, edema, persistant/recurrent cough, hemoptysis, dyspnea (rest/exertional/paroxysmal nocturnal), gastrointestinal bleeding (melena, rectal bleeding), abdominal pain, significant heartburn, bowel changes, GU symptoms (dysuria, hematuria, incontinence), Gyn symptoms (abnormal  bleeding, pain),  syncope, focal weakness, memory loss, numbness & tingling, skin/hair/nail changes, abnormal bruising or bleeding, anxiety, or depression.   + decreased hearing L ear    Objective:   Physical Exam General Appearance:    Alert, cooperative, no distress, appears stated age  Head:    Normocephalic, without obvious abnormality, atraumatic  Eyes:    PERRL, conjunctiva/corneas clear, EOM's intact, fundi    benign, both eyes  Ears:    Normal TM's and external ear canals, both ears  Nose:   Nares normal, septum midline, mucosa normal, no drainage    or sinus tenderness  Throat:   Lips, mucosa, and tongue normal; teeth and gums normal  Neck:   Supple, symmetrical, trachea midline, no adenopathy;    Thyroid: no enlargement/tenderness/nodules  Back:     Symmetric, no curvature, ROM normal, no CVA tenderness  Lungs:     Clear to auscultation bilaterally, respirations unlabored  Chest Wall:    No tenderness or deformity   Heart:    Regular rate and rhythm, S1 and S2 normal, no murmur, rub   or gallop  Breast Exam:    Deferred to mammo  Abdomen:     Soft, non-tender, bowel sounds active all four quadrants,    no masses, no organomegaly  Genitalia:    Deferred    Rectal:    Extremities:   Extremities normal, atraumatic, no cyanosis or edema  Pulses:   2+ and symmetric all extremities  Skin:   Skin color, texture, turgor normal, no rashes or lesions  Lymph nodes:   Cervical, supraclavicular, and axillary nodes normal  Neurologic:   CNII-XII intact, normal strength, sensation and reflexes    throughout          Assessment & Plan:

## 2016-07-31 ENCOUNTER — Other Ambulatory Visit: Payer: Self-pay | Admitting: Internal Medicine

## 2016-07-31 MED ORDER — SYNTHROID 112 MCG PO TABS: 112.0000 ug | ORAL_TABLET | Freq: Every day | ORAL | 3 refills | Status: DC

## 2016-08-02 ENCOUNTER — Telehealth: Payer: Self-pay

## 2016-08-02 NOTE — Telephone Encounter (Signed)
OK, no pb, the TSH was not very high.

## 2016-08-02 NOTE — Telephone Encounter (Signed)
Patient called in and stated that after you received the labs from her PCP, you called in and changed the dose of the synthroid. Patient felt very strongly about staying on the same dose she was on before. She did not want to increase as she felt good and has been doing good. Please advise. Thank you!

## 2016-08-03 ENCOUNTER — Other Ambulatory Visit: Payer: Self-pay

## 2016-08-03 ENCOUNTER — Telehealth: Payer: Self-pay

## 2016-08-03 MED ORDER — LEVOTHYROXINE SODIUM 100 MCG PO TABS: 100.0000 ug | ORAL_TABLET | Freq: Every day | ORAL | 1 refills | Status: DC

## 2016-08-03 NOTE — Telephone Encounter (Signed)
Called and notified patient of Dr.Gherghe's note. RX submitted.

## 2016-08-03 NOTE — Telephone Encounter (Signed)
Called and notified patient, rx submitted.

## 2016-08-10 ENCOUNTER — Ambulatory Visit (INDEPENDENT_AMBULATORY_CARE_PROVIDER_SITE_OTHER): Payer: BLUE CROSS/BLUE SHIELD | Admitting: Family Medicine

## 2016-08-10 ENCOUNTER — Encounter: Payer: Self-pay | Admitting: Family Medicine

## 2016-08-10 ENCOUNTER — Other Ambulatory Visit (HOSPITAL_COMMUNITY)
Admission: RE | Admit: 2016-08-10 | Discharge: 2016-08-10 | Disposition: A | Discharge status: Home / Self Care | Payer: BLUE CROSS/BLUE SHIELD | Source: Ambulatory Visit | Attending: Family Medicine | Admitting: Family Medicine

## 2016-08-10 VITALS — BP 110/80 | HR 59 | Temp 98.1°F | Resp 16 | Ht 65.0 in | Wt 142.2 lb

## 2016-08-10 DIAGNOSIS — Z124 Encounter for screening for malignant neoplasm of cervix: Secondary | ICD-10-CM | POA: Diagnosis not present

## 2016-08-10 NOTE — Addendum Note (Signed)
Addended by: Sheliah HatchABORI, Damariz Paganelli E on: 08/10/2016 01:41 PM   Modules accepted: Orders

## 2016-08-10 NOTE — Progress Notes (Signed)
   Subjective:    Patient ID: Haley Montgomery, female    DOB: 1956-03-03, [age] y.o.   MRN: [id number]  HPI Here today for pap and breast exam.  Denies concerns.   Review of Systems Denies vaginal d/c, pain, STD concerns, breast lumps, tenderness    Objective:   Physical Exam  Pulmonary/Chest: Right breast exhibits no inverted nipple, no mass, no nipple discharge, no skin change and no tenderness. Left breast exhibits no inverted nipple, no mass, no nipple discharge, no skin change and no tenderness.  Genitourinary: Rectal exam shows no external hemorrhoid, no fissure and anal tone normal. There is no rash, tenderness, lesion or injury on the right labia. There is no rash, tenderness, lesion or injury on the left labia. Uterus is not deviated, not enlarged, not fixed and not tender. Cervix exhibits no motion tenderness, no discharge and no friability. Right adnexum displays no mass, no tenderness and no fullness. Left adnexum displays no mass, no tenderness and no fullness. No erythema, tenderness or bleeding in the vagina. No foreign body in the vagina. No signs of injury around the vagina. No vaginal discharge found.          Assessment & Plan:  Pap- pt's breast and pelvic exam WNL.  Pap collected.

## 2016-08-10 NOTE — Patient Instructions (Signed)
Follow up in 1 year or as needed We'll notify you of your pap results Please call [facility] to schedule your [facility]: [phone] Call with any questions or concerns [place]!!!

## 2016-08-10 NOTE — Progress Notes (Signed)
Pre visit review using our clinic review tool, if applicable. No additional management support is needed unless otherwise documented below in the visit note. 

## 2016-08-11 LAB — CYTOLOGY - PAP
DIAGNOSIS: NEGATIVE
HPV: NOT DETECTED

## 2016-08-31 DIAGNOSIS — L720 Epidermal cyst: Secondary | ICD-10-CM | POA: Diagnosis not present

## 2016-08-31 DIAGNOSIS — D225 Melanocytic nevi of trunk: Secondary | ICD-10-CM | POA: Diagnosis not present

## 2016-08-31 DIAGNOSIS — L820 Inflamed seborrheic keratosis: Secondary | ICD-10-CM | POA: Diagnosis not present

## 2016-08-31 DIAGNOSIS — L821 Other seborrheic keratosis: Secondary | ICD-10-CM | POA: Diagnosis not present

## 2016-09-20 ENCOUNTER — Encounter: Payer: Self-pay | Admitting: Internal Medicine

## 2016-09-20 ENCOUNTER — Ambulatory Visit (INDEPENDENT_AMBULATORY_CARE_PROVIDER_SITE_OTHER): Payer: BLUE CROSS/BLUE SHIELD | Admitting: Internal Medicine

## 2016-09-20 VITALS — BP 112/82 | HR 58 | Ht 63.0 in | Wt 143.0 lb

## 2016-09-20 DIAGNOSIS — E890 Postprocedural hypothyroidism: Secondary | ICD-10-CM

## 2016-09-20 LAB — T3, FREE: T3, Free: 2.8 pg/mL (ref 2.3–4.2)

## 2016-09-20 LAB — TSH: TSH: 6.71 u[IU]/mL — AB (ref 0.35–4.50)

## 2016-09-20 LAB — T4, FREE: Free T4: 0.69 ng/dL (ref 0.60–1.60)

## 2016-09-20 NOTE — Progress Notes (Signed)
Subjective:     Patient ID: Haley HammingKaren V Montgomery, female   DOB: Aug 21, 1955, 61 y.o.   MRN: [id number]  HPI Haley Montgomery is a 61 y.o. woman of hypothyroidism. Last visit 6 mo ago. Reviewed hx:  Pt was dx with a "thyroid problem" (likely hyperthyroidism) in 2001 - she describes paroxysms of hot flushes, blurry vision, anxiety, dizziness, fainting. She also had a very large goiter at that time. 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   She was treated with RAI ablation in 02/16/2000. She felt extremely poorly soon after the treatment and saw Dr. Gerrit FriendsGerkin, who recommended surgery to remove the goiter.   She had total thyroidectomy on 08/06/2000 - pathology: TOTAL THYROIDECTOMY: THYROID GLAND WITH FIBROSIS, FOCAL CHRONIC INFLAMMATION AND NONSPECIFIC EPITHELIAL ATYPIA, NO TUMOR IDENTIFIED. Pt started Levothyroxine 125 mcg daily after the surgery, then decreased to 112 mcg daily..   In 2016, she stopped the Levothyroxine 112 b/c she was feeling poorly >> we changed to Synthroid brand name.  Pt is now on Synthroid DAW 100 mcg daily (increased 03/2016 >> TSH 07/2016 still slightly high but she preferred to stay on same dose. She takes this: - in am - fasting - at least 30 min from b'fast - no Ca, Fe, MVI, PPIs - not on Biotin - on a probiotic (h/o UTI), omega 3 FAs)  Reviewed previous thyroid tests: Lab Results  Component Value Date   TSH 4.94 (H) 07/27/2016   TSH 7.16 (H) 03/22/2016   TSH 9.45 (H) 12/17/2015   TSH 4.97 (H) 06/03/2015   TSH 10.64 (H) 04/07/2015   FREET4 1.14 03/22/2016   FREET4 0.88 12/17/2015   FREET4 0.96 06/03/2015   FREET4 0.80 04/07/2015   FREET4 0.58 (L) 01/05/2015   Pt denies: - feeling nodules in neck - hoarseness - dysphagia - choking - SOB with lying down  Also has HTN, HL, GERD.  Her daughter was murdered. Her son recently embezzled her firm >> has to file for bankuptcy.  Review of Systems Constitutional: + weight gain, +  fatigue, + night sweats Eyes: + blurry vision, no xerophthalmia ENT: no sore throat, no nodules palpated in throat, no dysphagia, no odynophagia, no hoarseness Cardiovascular: no CP/no SOB/no palpitations/no leg swelling Respiratory: no cough/no SOB/no wheezing Gastrointestinal: no N/no V/no D/no C/+ acid reflux Musculoskeletal: no muscle aches/no joint aches Skin: no rashes, no hair loss Neurological: no tremors/no numbness/no tingling/no dizziness  I reviewed pt's medications, allergies, PMH, social hx, family hx, and changes were documented in the history of present illness. Otherwise, unchanged from my initial visit note.  Objective:   Physical Exam BP 112/82   Pulse (!) 58   Ht 5\' 3"  (1.6 m)   Wt 143 lb (64.9 kg)   SpO2 95%   BMI 25.33 kg/m  Weight: 143 lb (64.9 kg)  Wt Readings from Last 3 Encounters:  09/20/16 143 lb (64.9 kg)  08/10/16 142 lb 4 oz (64.5 kg)  07/27/16 142 lb 2 oz (64.5 kg)   Constitutional: normal weight, in NAD Eyes: PERRLA, EOMI, no exophthalmos ENT: moist mucous membranes, no thyromegaly, no cervical lymphadenopathy Cardiovascular: RRR, No MRG Respiratory: CTA B Gastrointestinal: abdomen soft, NT, ND, BS+ Musculoskeletal: no deformities, strength intact in all 4 Skin: moist, warm, no rashes Neurological: no tremor with outstretched hands, DTR normal in all 4  Assessment:     1. Hypothyroidism  - s/p RAI ablation 02/2000 - s/p total thyroidectomy 07/2000  Component     Latest  Ref Rng 04/28/2013 04/28/2013 04/28/2013         9:22 AM  9:26 AM  9:30 AM  Cortisol, Plasma      11.9 23.3 29.5  No adrenal insufficiency.     Plan:     Pt with long standing hypothyroidism, now on Synthroid 100 mcg  - we reviewed her latest TFTs >> TSH high, improving. After last check, when TSH returned 4.9, I suggested to increase LT4 to 112 mcg but she refused. Today she mentions that she thought that the increase in dose would increase TSH further... AShe  accepts to increase the dose if needed after today's check, especially since she has some sxs of hypothyr: fatigue, falling asleep, weight gain. Some are unrelated: GERD, blurry vision. - we discussed about taking the thyroid hormone every day, with water, >30 minutes before breakfast, separated by >4 hours from acid reflux medications, calcium, iron, multivitamins. Pt. is taking it correctly - will check thyroid tests today: TSH and fT4 - If labs are abnormal, she will need to return for repeat TFTs in 1.5 months - OTW, RTC in 6 mo  Component     Latest Ref Rng & Units 09/20/2016  TSH     0.35 - 4.50 uIU/mL 6.71 (H)  T4,Free(Direct)     0.60 - 1.60 ng/dL 1.61  Triiodothyronine,Free,Serum     2.3 - 4.2 pg/mL 2.8  TSH is higher >> will increase LT4 to 112 mcg. She needs a repeat TFT set in 1.5 mo.  Carlus Pavlov, MD PhD [facility] Endocrinology

## 2016-09-20 NOTE — Patient Instructions (Addendum)
Please stop at the lab.  Please continue Synthroid 100 mcg daily.  Take the thyroid hormone every day, with water, at least 30 minutes before breakfast, separated by at least 4 hours from: - acid reflux medications - calcium - iron - multivitamins  Please come back for a follow-up appointment in 6 months.  

## 2016-09-21 MED ORDER — LEVOTHYROXINE SODIUM 112 MCG PO TABS: 112.0000 ug | ORAL_TABLET | Freq: Every day | ORAL | 1 refills | Status: DC

## 2016-11-06 ENCOUNTER — Other Ambulatory Visit (INDEPENDENT_AMBULATORY_CARE_PROVIDER_SITE_OTHER): Payer: BLUE CROSS/BLUE SHIELD

## 2016-11-06 DIAGNOSIS — E890 Postprocedural hypothyroidism: Secondary | ICD-10-CM | POA: Diagnosis not present

## 2016-11-06 LAB — TSH: TSH: 1.38 u[IU]/mL (ref 0.35–4.50)

## 2016-11-06 LAB — T4, FREE: FREE T4: 1.23 ng/dL (ref 0.60–1.60)

## 2016-11-07 ENCOUNTER — Ambulatory Visit: Payer: BLUE CROSS/BLUE SHIELD | Attending: Family Medicine | Admitting: Audiology

## 2016-11-07 DIAGNOSIS — H9190 Unspecified hearing loss, unspecified ear: Secondary | ICD-10-CM

## 2016-11-07 DIAGNOSIS — H918X9 Other specified hearing loss, unspecified ear: Secondary | ICD-10-CM | POA: Insufficient documentation

## 2016-11-07 DIAGNOSIS — H90A31 Mixed conductive and sensorineural hearing loss, unilateral, right ear with restricted hearing on the contralateral side: Secondary | ICD-10-CM | POA: Insufficient documentation

## 2016-11-07 DIAGNOSIS — R42 Dizziness and giddiness: Secondary | ICD-10-CM | POA: Diagnosis not present

## 2016-11-07 DIAGNOSIS — H90A22 Sensorineural hearing loss, unilateral, left ear, with restricted hearing on the contralateral side: Secondary | ICD-10-CM | POA: Insufficient documentation

## 2016-11-07 DIAGNOSIS — R292 Abnormal reflex: Secondary | ICD-10-CM | POA: Diagnosis not present

## 2016-11-07 NOTE — Procedures (Signed)
[facility]patient [facility]  [address]  [place], [place] [address]  [phone]   Audiological Evaluation  Patient Name: Katha HammingKaren V Achterberg   Status: Outpatient   DOB: [birth year]    Diagnosis: Hearing Loss                MRN: [id number] Date:  11/07/2016     Referent: Sheliah Hatchabori, Katherine E, MD    History: Katha HammingKaren V Morini was seen for an audiological evaluation. Primary Concern: Difficulty hearing, especially in the left ear.  Pain: None History of hearing problems: Y - for the past 2 years.  Prior to a dentist attributed a left ear hearing deficit to a "torqued dental implant" - hearing returned when the "screw in the implant was removed by another dentist".  History of ear infections:  Y with "swimmers ear as a kid". History of ear surgery or "tubes" : N History of dizziness/vertigo:   Y - Had "thyroid removed 07/2000" since then "experience dizziness when car next to her backs up on the left side only".  Spins toward the right side - lasts 30 - 60 seconds. Since had "thyroid disease" sensitive to movement in cars (drives self), can't go on amusement rides. Can also be set up from bending over and then standing up. 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  Other concerns: From 2003-2006 had numerous teeth and ear infections.     Medications: Synthroid 112 mcg   Evaluation: Conventional pure tone audiometry from 250Hz  - 8000Hz  with using insert earphones.  Hearing Thresholds: Right ear:  Thresholds of 20-50 dBHL. The hearing loss appears mixed.  Left ear:    Thresholds of  25-55 dBHL. The hearing loss appears sensorineural. Reliability is good Speech reception levels (repeating words near threshold) using recorded spondee word lists:  Right  ear: 30 dBHL.  Left ear:  35 dBHL Word recognition (at comfortably loud volumes) using recorded NU-6 word lists in quiet.  Right ear: 100% at 70 dBHL.  Left ear:   94% at 75 dBHL. Tympanometry (middle ear function) shows normal middle ar volume, pressure and compliance bilaterally with absent acoustic reflexes from 500Hz  - 4000Hz  in each ear.   CONCLUSION:      Clydie BraunKaren has a significant hearing loss with vertigo/balance issues with slight movement physically or visually. Clydie BraunKaren needs referral to an ENT.  She also need referral to [facility] location for a balance assessment and to rule out/treat BPPV.    Katha HammingKaren V Victory has a moderate low and high frequency hearing loss bilaterally that is poorer on the left side. Hearing improves to normal limits at 2000Hz  only.  The hearing loss on the left side is sensorineural and the hearing loss on the right side is mixed. This amount of hearing loss will adversely affect speech communication at normal conversational speech levels. Word recognition is excellent in each ear at very loud levels, equivalent to very loud talking at 2-3 feet from her ear.Clydie BraunKaren may benefit from amplification; therefore a hearing aid evaluation is recommended.    As discussed with Katha HammingKaren V Cuadros, amplification helps make the signal louder and therefore often improves hearing and word recognition.  Amplification has many forms including hearing aids in one or both ears,  an assistive listening device which have a microphone and speaker such as a small handheld device and/or even a surround sound system of speakers.  Amplification may be covered by some insurances, but not all.  It is important to note that hearing aids must be individually fit according to the hearing test results and the ear shape.  Audiologists and hearing aid dealers in [place] must be licensed in order to dispense hearing aids.  In addition, a trial period is mandated by  law in our state because often amplification must be tried and then evaluated in order to determine benefit.  There are many excellent choices when it comes to amplification in our area and providers are listed in the phone book under hearing aids, may be affiliated with Ear, Nose and Throat physicians, are located at [facility] and [employer] as well as the [facility]. The test results were discussed and Katha HammingKaren V Lesmeister counseled.   RECOMMENDATIONS: 1.   ENT referral for a) hearing loss b) absent acoustic reflex c) vertigo with visual movement. 2.  Hearing aid evaluation. 3.  Referral to the [facility] for balance assessment/ fall risk. 4.  Monitor hearing closely with a repeat audiological evaluation in 3-6 months (earlier if there is any change in hearing or ear pressure).   5.  Strategies that help improve hearing include: A) Face the speaker directly. Optimal is having the speakers face well - lit.  Unless amplified, being within 3-6 feet of the speaker will enhance word recognition. B) Avoid having the speaker back-lit as this will minimize the ability to use cues from lip-reading, facial expression and gestures. C)  Word recognition is poorer in background noise. For optimal word recognition, turn off the TV, radio or noisy fan when engaging in conversation. In a restaurant, try to sit away from noise sources and close to the primary speaker.  D)  Ask for topic clarification from time to time in order to remain in the conversation.  Most people don't mind repeating or clarifying a point when asked.  If needed, explain the difficulty hearing in background noise or hearing loss.  Deborah L. Kate SableWoodward, Au.D., CCC-A Doctor of Audiology 11/07/2016  cc: Sheliah Hatchabori, Katherine E, MD

## 2016-11-08 ENCOUNTER — Other Ambulatory Visit: Payer: Self-pay | Admitting: Family Medicine

## 2016-11-08 DIAGNOSIS — H9190 Unspecified hearing loss, unspecified ear: Secondary | ICD-10-CM

## 2016-11-08 DIAGNOSIS — R42 Dizziness and giddiness: Secondary | ICD-10-CM

## 2016-11-08 DIAGNOSIS — R2689 Other abnormalities of gait and mobility: Secondary | ICD-10-CM

## 2016-11-08 NOTE — Progress Notes (Signed)
Referrals placed 

## 2016-11-09 ENCOUNTER — Telehealth: Payer: Self-pay | Admitting: Internal Medicine

## 2016-11-09 NOTE — Telephone Encounter (Signed)
Routing to you °

## 2016-11-09 NOTE — Telephone Encounter (Signed)
Patient experiencing heart burn, insomia, loss of app after medication was changed.  Please advise.  -LL

## 2016-11-09 NOTE — Telephone Encounter (Signed)
Please advise on what to do regarding the side effects.  Thank you!

## 2016-11-10 ENCOUNTER — Ambulatory Visit (INDEPENDENT_AMBULATORY_CARE_PROVIDER_SITE_OTHER): Payer: BLUE CROSS/BLUE SHIELD | Admitting: Physical Therapy

## 2016-11-10 DIAGNOSIS — R2681 Unsteadiness on feet: Secondary | ICD-10-CM

## 2016-11-10 DIAGNOSIS — R42 Dizziness and giddiness: Secondary | ICD-10-CM | POA: Diagnosis not present

## 2016-11-10 NOTE — Telephone Encounter (Signed)
Patient also having vertigo problems along with other symptoms noted below. Patient had PT today and was told thyroid medication might be causing this. Please call patient and advise. OK to leave message.

## 2016-11-10 NOTE — Patient Instructions (Signed)
Feet Together (Compliant Surface) Varied Arm Positions - Eyes Closed    Stand on compliant surface: _pillow or cushion__ with feet together and arms at your side. Close eyes and try to stand. Hold__10-15__ seconds. Repeat __5__ times per session. Do __2-3__ sessions per day.

## 2016-11-10 NOTE — Therapy (Signed)
[facility] [address] [place], [place], [address] Phone: [phone]   Fax:  [phone]  Physical Therapy Evaluation  Patient Details  Name: Haley Montgomery MRN: [id number] Date of Birth: Jun 27, 1955 Referring Provider: Dr. Lezlie Octave  Encounter Date: 11/10/2016      PT End of Session - 11/10/16 1159    Visit Number 1   Number of Visits 6   Date for PT Re-Evaluation 12/22/16   Authorization Type 30 visit limit - [employer]   PT Start Time 1058   PT Stop Time 1148   PT Time Calculation (min) 50 min   Activity Tolerance Patient tolerated treatment well   Behavior During Therapy [facility] for tasks assessed/performed      Past Medical History:  Diagnosis Date  . ESSENTIAL HYPERTENSION 08/11/2009  . HYPERLIPIDEMIA 09/14/2008  . HYPOTHYROIDISM 09/14/2008  . MRI, BRAIN, ABNORMAL 08/16/2009    Past Surgical History:  Procedure Laterality Date  . THYROIDECTOMY  2002    There were no vitals filed for this visit.       Subjective Assessment - 11/10/16 1101    Subjective Pt is a 61 y/o female who presents to OPPT for vertigo.  Pt referred by PCP to audiologist, and audiologist recommended pt follow up with vestibular PT for vertigo.  Pt reports initial symptoms began in 2002 following thyriodectomy.  Pt c/o feeling like she's still on a ride after getting off, and nausea/lightheadedness when cars move to left only and she is stationary.   Patient Stated Goals unsure at this time   Currently in Pain? No/denies            [facility] PT Assessment - 11/10/16 1104      Assessment   Medical Diagnosis vertigo, dizziness   Referring Provider Dr. Lezlie Octave   Onset Date/Surgical Date --  2002   Next MD Visit PRN   Prior Therapy n/a     Precautions   Precautions None     Restrictions   Weight Bearing Restrictions No     Balance Screen   Has the patient fallen in the past 6 months No   Has the patient had a decrease in activity level  because of a fear of falling?  No   Is the patient reluctant to leave their home because of a fear of falling?  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   Leisure visit with friends, cook, puzzles with granddaughter     Cognition   Overall Cognitive Status Within Functional Limits for tasks assessed     High Level Balance   High Level Balance Comments unable to stand on compliant surface with EC and feet together            Vestibular Assessment - 11/10/16 1108      Vestibular Assessment   General Observation slight head tilt to right     Symptom Behavior   Type of Dizziness Lightheadedness  lightheadedness   Frequency of Dizziness car moving to left when pt is stationary; returning to standing with bending over   Duration of Dizziness ~1 min   Aggravating Factors Sit to stand;Supine to sit  see above   Relieving Factors Closing eyes  Occulomotor Exam   Occulomotor Alignment Normal   Spontaneous Absent   Gaze-induced Absent   Head shaking Horizontal Absent   Head Shaking Vertical Absent   Smooth Pursuits Intact   Saccades Intact   Comment head impulse test negative     Vestibulo-Occular Reflex   VOR 1 Head Only (x 1 viewing) WNL     Positional Testing   Dix-Hallpike Dix-Hallpike Right;Dix-Hallpike Left   Horizontal Canal Testing Horizontal Canal Right;Horizontal Canal Left     Dix-Hallpike Right   Dix-Hallpike Right Duration none   Dix-Hallpike Right Symptoms No nystagmus     Dix-Hallpike Left   Dix-Hallpike Left Duration none   Dix-Hallpike Left Symptoms No nystagmus     Horizontal Canal Right   Horizontal Canal Right Duration none   Horizontal Canal Right Symptoms Normal     Horizontal Canal Left    Horizontal Canal Left Duration none   Horizontal Canal Left Symptoms Normal     Orthostatics   BP supine (x 5 minutes) 112/70   HR supine (x 5 minutes) 58   BP standing (after 1 minute) 96/56   HR standing (after 1 minute) 66   BP standing (after 3 minutes) 94/67   HR standing (after 3 minutes) 76        Objective measurements completed on examination: See above findings.                  PT Education - 11/10/16 1159    Education provided Yes   Education Details orthostatic hypotension, HEP   Person(s) Educated Patient   Methods Explanation;Demonstration;Handout   Comprehension Verbalized understanding;Need further instruction          PT Short Term Goals - 11/10/16 1203      PT SHORT TERM GOAL #1   Title perform FGA with appropriate LTG goal to be written   Time 2   Period Weeks   Status New   Target Date 11/24/16           PT Long Term Goals - 11/10/16 1202      PT LONG TERM GOAL #1   Title independent with HEP    Time 6   Period Weeks   Status New   Target Date 12/22/16     PT LONG TERM GOAL #2   Title report 50% improvement in symptoms for improved function   Time 6   Period Weeks   Status New   Target Date 12/22/16                Plan - 11/10/16 1159    Clinical Impression Statement Pt presents to OPPT for vertigo and symptoms of imbalance and lightheadedness.  Only able to minimally provoke symptoms today with head shaking but no nystagmus seen.  BP indicates pt has orthostatic hypotension, and recommended pt follow up with MD regarding this.  Pt demonstrates decreased balance on compliant surfaces suggestive of decreased vestibular input.  Will benefit from PT to address deficits listed.   History and Personal Factors relevant to plan of care: hypothyroidism   Clinical Presentation Stable   Clinical Decision Making Low   Rehab Potential Good   PT Frequency 1x / week   PT Duration 6 weeks   PT Treatment/Interventions  ADLs/Self Care Home Management;Canalith Repostioning;Neuromuscular re-education;Balance training;Therapeutic exercise;Therapeutic activities;Functional mobility training;Stair training;Gait training;Patient/family education;Vestibular   PT Next Visit Plan review HEP, compliant surface activities, perform FGA   Consulted and Agree with Plan of Care Patient  Patient will benefit from skilled therapeutic intervention in order to improve the following deficits and impairments:  Decreased balance, Dizziness  Visit Diagnosis: Dizziness and giddiness - Plan: PT plan of care cert/re-cert  Unsteadiness on feet - Plan: PT plan of care cert/re-cert     Problem List Patient Active Problem List   Diagnosis Date Noted  . Physical exam 07/27/2016  . 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 Kidney stones 06/19/2012  . UTI (lower urinary tract infection) 02/20/2011  . ALLERGIC RHINITIS 01/20/2010  . MRI, BRAIN, ABNORMAL 08/16/2009  . BACK PAIN, LUMBAR 07/05/2009  . DIZZINESS 07/05/2009      Clarita CraneStephanie F Latia Mataya, PT, DPT 11/10/16 12:05 PM     [facility] [address] [place], [place], [address] Phone: [phone]   Fax:  [phone]  Name: Haley Montgomery MRN: [id number] Date of Birth: Jul 08, 1955

## 2016-11-13 ENCOUNTER — Telehealth: Payer: Self-pay

## 2016-11-13 ENCOUNTER — Other Ambulatory Visit: Payer: Self-pay

## 2016-11-13 MED ORDER — LEVOTHYROXINE SODIUM 100 MCG PO TABS: ORAL_TABLET | ORAL | 1 refills | Status: AC

## 2016-11-13 NOTE — Telephone Encounter (Signed)
Called and LVM advised patient of MD note. I advised patient to call back to let me know if I needed to submit the 100 mcg back into the pharmacy or if she had some already.   Left call back number to confirm

## 2016-11-13 NOTE — Telephone Encounter (Signed)
Called to advise patient RX was submitted.

## 2016-11-13 NOTE — Telephone Encounter (Signed)
OK, let's go back to 100 mcg LT4 for now. We can try to alternate 100 with 112 mcg after she starts feeling better, if she agrees.

## 2016-11-13 NOTE — Telephone Encounter (Signed)
Patient returning your call, would like sent to pharmacy.   Please call back to confirm.  -LL

## 2016-11-13 NOTE — Telephone Encounter (Signed)
Submitted RX, notified patient.

## 2016-11-13 NOTE — Telephone Encounter (Signed)
Called and LVM advised patient of MD note. I advised patient to call back to let me know if I needed to submit the 100 mcg back into the pharmacy or if she had some already.   Left call back number to confirm 

## 2016-11-15 ENCOUNTER — Ambulatory Visit (INDEPENDENT_AMBULATORY_CARE_PROVIDER_SITE_OTHER): Payer: BLUE CROSS/BLUE SHIELD | Admitting: Physical Therapy

## 2016-11-15 DIAGNOSIS — R42 Dizziness and giddiness: Secondary | ICD-10-CM

## 2016-11-15 DIAGNOSIS — R2681 Unsteadiness on feet: Secondary | ICD-10-CM

## 2016-11-15 NOTE — Therapy (Addendum)
[facility] [address] [place], [place], [address] Phone: [phone]   Fax:  [phone]  Physical Therapy Treatment  Patient Details  Name: Haley Montgomery MRN: [id number] Date of Birth: [birth year] Referring Provider: Dr. Dimple Nanas  Encounter Date: 11/15/2016      PT End of Session - 11/15/16 1212    Visit Number 2   Number of Visits 6   Date for PT Re-Evaluation 12/22/16   Authorization Type 30 visit limit - [employer]   PT Start Time 1013   PT Stop Time 1036   PT Time Calculation (min) 23 min   Activity Tolerance Patient tolerated treatment well   Behavior During Therapy [facility] for tasks assessed/performed      Past Medical History:  Diagnosis Date  . ESSENTIAL HYPERTENSION 08/11/2009  . HYPERLIPIDEMIA 09/14/2008  . HYPOTHYROIDISM 09/14/2008  . MRI, BRAIN, ABNORMAL 08/16/2009    Past Surgical History:  Procedure Laterality Date  . THYROIDECTOMY  2002    There were no vitals filed for this visit.      Subjective Assessment - 11/15/16 1013    Subjective doing well; had a car pull out as pt pulled into parking lot and no symptoms.  denies any symptoms since last session.   Patient Stated Goals unsure at this time   Currently in Pain? No/denies            [facility] PT Assessment - 11/15/16 1033      Functional Gait  Assessment   Gait assessed  Yes   Gait Level Surface Walks 20 ft in less than 5.5 sec, no assistive devices, good speed, no evidence for imbalance, normal gait pattern, deviates no more than 6 in outside of the 12 in walkway width.   Change in Gait Speed Able to smoothly change walking speed without loss of balance or gait deviation. Deviate no more than 6 in outside of the 12 in walkway width.   Gait with Horizontal Head Turns Performs head turns smoothly with no change in gait. Deviates no more than 6 in outside 12 in walkway width   Gait with Vertical Head Turns Performs head turns with no change in gait. Deviates  no more than 6 in outside 12 in walkway width.   Gait and Pivot Turn Pivot turns safely within 3 sec and stops quickly with no loss of balance.   Step Over Obstacle Is able to step over 2 stacked shoe boxes taped together (9 in total height) without changing gait speed. No evidence of imbalance.   Gait with Narrow Base of Support Ambulates 4-7 steps.   Gait with Eyes Closed Walks 20 ft, no assistive devices, good speed, no evidence of imbalance, normal gait pattern, deviates no more than 6 in outside 12 in walkway width. Ambulates 20 ft in less than 7 sec.   Ambulating Backwards Walks 20 ft, no assistive devices, good speed, no evidence for imbalance, normal gait   Steps Alternating feet, no rail.   Total Score 28                      Vestibular Treatment/Exercise - 11/15/16 1211      Vestibular Treatment/Exercise   Gaze Exercises X1 Viewing Horizontal;X1 Viewing Vertical     X1 Viewing Horizontal   Foot Position feet apart   Time --  30 sec   Reps 1   Comments with full field stimulus; no increase in symptoms     X1 Viewing  Vertical   Foot Position feet apart   Time --  30 sec   Reps 1   Comments with full field stimulus; no increase in symptoms            Balance Exercises - 11/15/16 1158      Balance Exercises: Standing   Standing Eyes Closed Foam/compliant surface;Narrow base of support (BOS);Head turns   Partial Tandem Stance Eyes open;Foam/compliant surface  head turns horizontal/vertical             PT Short Term Goals - 11/15/16 1212      PT SHORT TERM GOAL #1   Title perform FGA with appropriate LTG goal to be written   Baseline 11/15/16: 28/30 - no goal necessary   Status Achieved           PT Long Term Goals - 11/15/16 1213      PT LONG TERM GOAL #1   Title independent with HEP    Status Achieved     PT LONG TERM GOAL #2   Title report 50% improvement in symptoms for improved function   Baseline 11/15/16: reports no symptoms  since last session   Status Achieved               Plan - 11/15/16 1213    Clinical Impression Statement Pt reports no symptoms since last visit, also reporting that medication dose was decreased which could have affected symptoms.  Pt scored 28/30 on FGA indicating low risk for falls.  At this time recommend hold from PT and pt to call if symptoms return.     PT Treatment/Interventions ADLs/Self Care Home Management;Canalith Repostioning;Neuromuscular re-education;Balance training;Therapeutic exercise;Therapeutic activities;Functional mobility training;Stair training;Gait training;Patient/family education;Vestibular   PT Next Visit Plan hold PT x 30 days; d/c if pt doesn't return   Consulted and Agree with Plan of Care Patient      Patient will benefit from skilled therapeutic intervention in order to improve the following deficits and impairments:  Decreased balance, Dizziness  Visit Diagnosis: Dizziness and giddiness  Unsteadiness on feet     Problem List Patient Active Problem List   Diagnosis Date Noted  . Physical exam 07/27/2016  . 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 Kidney stones 06/19/2012  . UTI (lower urinary tract infection) 02/20/2011  . ALLERGIC RHINITIS 01/20/2010  . MRI, BRAIN, ABNORMAL 08/16/2009  . BACK PAIN, LUMBAR 07/05/2009  . DIZZINESS 07/05/2009      Laureen Abrahams, PT, DPT 11/15/16 12:18 PM    [facility] [address] [place], [place], [address] Phone: [phone]   Fax:  [phone]  Name: Haley Montgomery MRN: [id number] Date of Birth: Dec 06, 1955       PHYSICAL THERAPY DISCHARGE SUMMARY  Visits from Start of Care: 2  Current functional level related to goals / functional outcomes: See above   Remaining deficits: n/a   Education / Equipment: HEP  Plan: Patient agrees to discharge.  Patient goals were met. Patient is being discharged due to meeting the stated  rehab goals.  ?????       Laureen Abrahams, PT, DPT 12/18/16 8:54 AM   [facility] [address] [place], [place], [address] Phone: [phone]  Fax: [phone]

## 2016-11-15 NOTE — Patient Instructions (Signed)
Feet Together (Compliant Surface) Head Motion - Eyes Closed    Stand on compliant surface: ___pillow or cushion____ with feet together. Close eyes and move head slowly, up and down 10 times each way and side to side 10 times each way. Repeat __1-2__ times per session. Do __2-3__ sessions per day.  Feet Partial Heel-Toe (Compliant Surface) Head Motion - Eyes Open    With eyes open, standing on compliant surface: __pillow or cushion___, right foot partially in front of the other, move head slowly: up and down 10 times each way and side to side 10 times each way.  Repeat with left foot partially in front.  Repeat __1-2__ times per session. Do __2-3__ sessions per day.

## 2016-11-24 ENCOUNTER — Encounter: Payer: Self-pay | Admitting: General Practice

## 2017-02-01 DIAGNOSIS — H2513 Age-related nuclear cataract, bilateral: Secondary | ICD-10-CM | POA: Diagnosis not present

## 2017-02-12 DIAGNOSIS — H2512 Age-related nuclear cataract, left eye: Secondary | ICD-10-CM | POA: Diagnosis not present

## 2017-02-12 DIAGNOSIS — H1859 Other hereditary corneal dystrophies: Secondary | ICD-10-CM | POA: Diagnosis not present

## 2017-02-12 DIAGNOSIS — H04123 Dry eye syndrome of bilateral lacrimal glands: Secondary | ICD-10-CM | POA: Diagnosis not present

## 2017-02-12 DIAGNOSIS — H2511 Age-related nuclear cataract, right eye: Secondary | ICD-10-CM | POA: Diagnosis not present

## 2017-02-15 ENCOUNTER — Other Ambulatory Visit: Payer: Self-pay | Admitting: Internal Medicine

## 2017-03-01 ENCOUNTER — Inpatient Hospital Stay (HOSPITAL_COMMUNITY): Payer: BLUE CROSS/BLUE SHIELD | Admitting: Certified Registered"

## 2017-03-01 ENCOUNTER — Encounter (HOSPITAL_COMMUNITY): Admission: EM | Disposition: E | Payer: Self-pay | Source: Home / Self Care | Attending: Neurology

## 2017-03-01 ENCOUNTER — Encounter (HOSPITAL_COMMUNITY): Payer: Self-pay | Admitting: Emergency Medicine

## 2017-03-01 ENCOUNTER — Inpatient Hospital Stay (HOSPITAL_COMMUNITY)
Admission: EM | Admit: 2017-03-01 | Discharge: 2017-03-10 | DRG: 023 | Disposition: E | Payer: BLUE CROSS/BLUE SHIELD | Attending: Neurology | Admitting: Neurology

## 2017-03-01 ENCOUNTER — Emergency Department (HOSPITAL_COMMUNITY): Payer: BLUE CROSS/BLUE SHIELD

## 2017-03-01 ENCOUNTER — Inpatient Hospital Stay (HOSPITAL_COMMUNITY): Payer: BLUE CROSS/BLUE SHIELD

## 2017-03-01 DIAGNOSIS — R29720 NIHSS score 20: Secondary | ICD-10-CM | POA: Diagnosis not present

## 2017-03-01 DIAGNOSIS — R29810 Facial weakness: Secondary | ICD-10-CM | POA: Diagnosis not present

## 2017-03-01 DIAGNOSIS — Z09 Encounter for follow-up examination after completed treatment for conditions other than malignant neoplasm: Secondary | ICD-10-CM | POA: Diagnosis not present

## 2017-03-01 DIAGNOSIS — E860 Dehydration: Secondary | ICD-10-CM | POA: Diagnosis present

## 2017-03-01 DIAGNOSIS — I82B12 Acute embolism and thrombosis of left subclavian vein: Secondary | ICD-10-CM | POA: Diagnosis not present

## 2017-03-01 DIAGNOSIS — I10 Essential (primary) hypertension: Secondary | ICD-10-CM | POA: Diagnosis not present

## 2017-03-01 DIAGNOSIS — Z66 Do not resuscitate: Secondary | ICD-10-CM | POA: Diagnosis not present

## 2017-03-01 DIAGNOSIS — S0181XA Laceration without foreign body of other part of head, initial encounter: Secondary | ICD-10-CM | POA: Diagnosis not present

## 2017-03-01 DIAGNOSIS — E785 Hyperlipidemia, unspecified: Secondary | ICD-10-CM | POA: Diagnosis present

## 2017-03-01 DIAGNOSIS — Z87891 Personal history of nicotine dependence: Secondary | ICD-10-CM

## 2017-03-01 DIAGNOSIS — I361 Nonrheumatic tricuspid (valve) insufficiency: Secondary | ICD-10-CM | POA: Diagnosis not present

## 2017-03-01 DIAGNOSIS — I63512 Cerebral infarction due to unspecified occlusion or stenosis of left middle cerebral artery: Principal | ICD-10-CM | POA: Diagnosis present

## 2017-03-01 DIAGNOSIS — G458 Other transient cerebral ischemic attacks and related syndromes: Secondary | ICD-10-CM | POA: Diagnosis present

## 2017-03-01 DIAGNOSIS — Z888 Allergy status to other drugs, medicaments and biological substances status: Secondary | ICD-10-CM | POA: Diagnosis not present

## 2017-03-01 DIAGNOSIS — Z8042 Family history of malignant neoplasm of prostate: Secondary | ICD-10-CM | POA: Diagnosis not present

## 2017-03-01 DIAGNOSIS — I6300 Cerebral infarction due to thrombosis of unspecified precerebral artery: Secondary | ICD-10-CM | POA: Diagnosis not present

## 2017-03-01 DIAGNOSIS — Z79890 Hormone replacement therapy (postmenopausal): Secondary | ICD-10-CM | POA: Diagnosis not present

## 2017-03-01 DIAGNOSIS — G936 Cerebral edema: Secondary | ICD-10-CM | POA: Diagnosis present

## 2017-03-01 DIAGNOSIS — J95821 Acute postprocedural respiratory failure: Secondary | ICD-10-CM | POA: Diagnosis not present

## 2017-03-01 DIAGNOSIS — Z9089 Acquired absence of other organs: Secondary | ICD-10-CM

## 2017-03-01 DIAGNOSIS — R531 Weakness: Secondary | ICD-10-CM | POA: Diagnosis not present

## 2017-03-01 DIAGNOSIS — E872 Acidosis: Secondary | ICD-10-CM

## 2017-03-01 DIAGNOSIS — J309 Allergic rhinitis, unspecified: Secondary | ICD-10-CM | POA: Diagnosis not present

## 2017-03-01 DIAGNOSIS — I63032 Cerebral infarction due to thrombosis of left carotid artery: Secondary | ICD-10-CM

## 2017-03-01 DIAGNOSIS — G8191 Hemiplegia, unspecified affecting right dominant side: Secondary | ICD-10-CM | POA: Diagnosis not present

## 2017-03-01 DIAGNOSIS — R4701 Aphasia: Secondary | ICD-10-CM | POA: Diagnosis not present

## 2017-03-01 DIAGNOSIS — I708 Atherosclerosis of other arteries: Secondary | ICD-10-CM | POA: Diagnosis not present

## 2017-03-01 DIAGNOSIS — Z9911 Dependence on respirator [ventilator] status: Secondary | ICD-10-CM | POA: Diagnosis not present

## 2017-03-01 DIAGNOSIS — Y839 Surgical procedure, unspecified as the cause of abnormal reaction of the patient, or of later complication, without mention of misadventure at the time of the procedure: Secondary | ICD-10-CM | POA: Diagnosis present

## 2017-03-01 DIAGNOSIS — E039 Hypothyroidism, unspecified: Secondary | ICD-10-CM | POA: Diagnosis not present

## 2017-03-01 DIAGNOSIS — I639 Cerebral infarction, unspecified: Secondary | ICD-10-CM | POA: Diagnosis not present

## 2017-03-01 DIAGNOSIS — Z79899 Other long term (current) drug therapy: Secondary | ICD-10-CM | POA: Diagnosis not present

## 2017-03-01 DIAGNOSIS — Z978 Presence of other specified devices: Secondary | ICD-10-CM | POA: Diagnosis not present

## 2017-03-01 DIAGNOSIS — Z4589 Encounter for adjustment and management of other implanted devices: Secondary | ICD-10-CM | POA: Diagnosis not present

## 2017-03-01 DIAGNOSIS — R9431 Abnormal electrocardiogram [ECG] [EKG]: Secondary | ICD-10-CM | POA: Diagnosis not present

## 2017-03-01 DIAGNOSIS — T82858A Stenosis of vascular prosthetic devices, implants and grafts, initial encounter: Secondary | ICD-10-CM | POA: Diagnosis not present

## 2017-03-01 DIAGNOSIS — Z841 Family history of disorders of kidney and ureter: Secondary | ICD-10-CM | POA: Diagnosis not present

## 2017-03-01 DIAGNOSIS — R402132 Coma scale, eyes open, to sound, at arrival to emergency department: Secondary | ICD-10-CM | POA: Diagnosis present

## 2017-03-01 DIAGNOSIS — S0990XA Unspecified injury of head, initial encounter: Secondary | ICD-10-CM | POA: Diagnosis not present

## 2017-03-01 DIAGNOSIS — Z833 Family history of diabetes mellitus: Secondary | ICD-10-CM | POA: Diagnosis not present

## 2017-03-01 DIAGNOSIS — I63232 Cerebral infarction due to unspecified occlusion or stenosis of left carotid arteries: Secondary | ICD-10-CM | POA: Diagnosis not present

## 2017-03-01 DIAGNOSIS — I63412 Cerebral infarction due to embolism of left middle cerebral artery: Secondary | ICD-10-CM | POA: Diagnosis not present

## 2017-03-01 DIAGNOSIS — R402212 Coma scale, best verbal response, none, at arrival to emergency department: Secondary | ICD-10-CM | POA: Diagnosis not present

## 2017-03-01 DIAGNOSIS — Z881 Allergy status to other antibiotic agents status: Secondary | ICD-10-CM

## 2017-03-01 DIAGNOSIS — J9600 Acute respiratory failure, unspecified whether with hypoxia or hypercapnia: Secondary | ICD-10-CM | POA: Diagnosis not present

## 2017-03-01 DIAGNOSIS — I4510 Unspecified right bundle-branch block: Secondary | ICD-10-CM | POA: Diagnosis present

## 2017-03-01 DIAGNOSIS — R609 Edema, unspecified: Secondary | ICD-10-CM | POA: Diagnosis present

## 2017-03-01 DIAGNOSIS — J9811 Atelectasis: Secondary | ICD-10-CM | POA: Diagnosis not present

## 2017-03-01 DIAGNOSIS — I6522 Occlusion and stenosis of left carotid artery: Secondary | ICD-10-CM | POA: Diagnosis not present

## 2017-03-01 DIAGNOSIS — J9602 Acute respiratory failure with hypercapnia: Secondary | ICD-10-CM

## 2017-03-01 DIAGNOSIS — R402352 Coma scale, best motor response, localizes pain, at arrival to emergency department: Secondary | ICD-10-CM | POA: Diagnosis present

## 2017-03-01 DIAGNOSIS — I161 Hypertensive emergency: Secondary | ICD-10-CM | POA: Diagnosis not present

## 2017-03-01 HISTORY — PX: RADIOLOGY WITH ANESTHESIA: SHX6223

## 2017-03-01 LAB — URINALYSIS, ROUTINE W REFLEX MICROSCOPIC
Bacteria, UA: NONE SEEN
Bilirubin Urine: NEGATIVE
GLUCOSE, UA: NEGATIVE mg/dL
Ketones, ur: 20 mg/dL — AB
Leukocytes, UA: NEGATIVE
NITRITE: NEGATIVE
PH: 6 (ref 5.0–8.0)
Protein, ur: 30 mg/dL — AB
Specific Gravity, Urine: >1.046 — ABNORMAL HIGH (ref 1.005–1.030)
Squamous Epithelial / LPF: NONE SEEN

## 2017-03-01 LAB — COMPREHENSIVE METABOLIC PANEL
ALK PHOS: 94 U/L (ref 38–126)
ALT: 14 U/L (ref 14–54)
AST: 27 U/L (ref 15–41)
Albumin: 4 g/dL (ref 3.5–5.0)
Anion gap: 10 (ref 5–15)
BILIRUBIN TOTAL: 1 mg/dL (ref 0.3–1.2)
BUN: 16 mg/dL (ref 6–20)
CALCIUM: 9.4 mg/dL (ref 8.9–10.3)
CHLORIDE: 106 mmol/L (ref 101–111)
CO2: 24 mmol/L (ref 22–32)
CREATININE: 0.91 mg/dL (ref 0.44–1.00)
Glucose, Bld: 120 mg/dL — ABNORMAL HIGH (ref 65–99)
Potassium: 4.4 mmol/L (ref 3.5–5.1)
SODIUM: 140 mmol/L (ref 135–145)
Total Protein: 7.2 g/dL (ref 6.5–8.1)

## 2017-03-01 LAB — I-STAT CHEM 8, ED
BUN: 18 mg/dL (ref 6–20)
CALCIUM ION: 1.18 mmol/L (ref 1.15–1.40)
Chloride: 106 mmol/L (ref 101–111)
Creatinine, Ser: 0.8 mg/dL (ref 0.44–1.00)
GLUCOSE: 119 mg/dL — AB (ref 65–99)
HCT: 49 % — ABNORMAL HIGH (ref 36.0–46.0)
Hemoglobin: 16.7 g/dL — ABNORMAL HIGH (ref 12.0–15.0)
Potassium: 4.4 mmol/L (ref 3.5–5.1)
SODIUM: 142 mmol/L (ref 135–145)
TCO2: 25 mmol/L (ref 22–32)

## 2017-03-01 LAB — CBC
HEMATOCRIT: 48.7 % — AB (ref 36.0–46.0)
Hemoglobin: 16.9 g/dL — ABNORMAL HIGH (ref 12.0–15.0)
MCH: 32.6 pg (ref 26.0–34.0)
MCHC: 34.7 g/dL (ref 30.0–36.0)
MCV: 93.8 fL (ref 78.0–100.0)
PLATELETS: 189 10*3/uL (ref 150–400)
RBC: 5.19 MIL/uL — AB (ref 3.87–5.11)
RDW: 13.2 % (ref 11.5–15.5)
WBC: 12 10*3/uL — AB (ref 4.0–10.5)

## 2017-03-01 LAB — DIFFERENTIAL
Basophils Absolute: 0 10*3/uL (ref 0.0–0.1)
Basophils Relative: 0 %
EOS PCT: 0 %
Eosinophils Absolute: 0 10*3/uL (ref 0.0–0.7)
LYMPHS PCT: 6 %
Lymphs Abs: 0.7 10*3/uL (ref 0.7–4.0)
MONO ABS: 0.3 10*3/uL (ref 0.1–1.0)
MONOS PCT: 2 %
NEUTROS ABS: 11 10*3/uL — AB (ref 1.7–7.7)
Neutrophils Relative %: 92 %

## 2017-03-01 LAB — I-STAT TROPONIN, ED: TROPONIN I, POC: 0.02 ng/mL (ref 0.00–0.08)

## 2017-03-01 LAB — ETHANOL

## 2017-03-01 LAB — CK TOTAL AND CKMB (NOT AT ARMC)
CK, MB: 11.7 ng/mL — ABNORMAL HIGH (ref 0.5–5.0)
RELATIVE INDEX: 2.3 (ref 0.0–2.5)
Total CK: 504 U/L — ABNORMAL HIGH (ref 38–234)

## 2017-03-01 LAB — RAPID URINE DRUG SCREEN, HOSP PERFORMED
AMPHETAMINES: NOT DETECTED
BARBITURATES: NOT DETECTED
BENZODIAZEPINES: NOT DETECTED
Cocaine: NOT DETECTED
Opiates: NOT DETECTED
Tetrahydrocannabinol: NOT DETECTED

## 2017-03-01 LAB — TSH: TSH: 2.406 u[IU]/mL (ref 0.350–4.500)

## 2017-03-01 LAB — APTT: aPTT: 27 seconds (ref 24–36)

## 2017-03-01 LAB — PROTIME-INR
INR: 1.01
PROTHROMBIN TIME: 13.2 s (ref 11.4–15.2)

## 2017-03-01 SURGERY — RADIOLOGY WITH ANESTHESIA
Anesthesia: General

## 2017-03-01 MED ORDER — EPTIFIBATIDE 20 MG/10ML IV SOLN
INTRAVENOUS | Status: AC
  Filled 2017-03-01: qty 10

## 2017-03-01 MED ORDER — ABCIXIMAB 2 MG/ML IV SOLN
10.0000 mg | INTRAVENOUS | Status: DC
  Filled 2017-03-01: qty 5

## 2017-03-01 MED ORDER — ASPIRIN 325 MG PO TABS
ORAL_TABLET | ORAL | Status: AC
  Filled 2017-03-01: qty 2

## 2017-03-01 MED ORDER — PROPOFOL 10 MG/ML IV BOLUS
INTRAVENOUS | Status: DC | PRN
  Administered 2017-03-01: 130 mg via INTRAVENOUS

## 2017-03-01 MED ORDER — SUFENTANIL CITRATE 50 MCG/ML IV SOLN
INTRAVENOUS | Status: DC | PRN
  Administered 2017-03-01: 10 ug via INTRAVENOUS

## 2017-03-01 MED ORDER — IOPAMIDOL (ISOVUE-300) INJECTION 61%
INTRAVENOUS | Status: AC
  Filled 2017-03-01: qty 150

## 2017-03-01 MED ORDER — PHENYLEPHRINE HCL 10 MG/ML IJ SOLN
INTRAVENOUS | Status: DC | PRN
  Administered 2017-03-01: 20 ug/min via INTRAVENOUS

## 2017-03-01 MED ORDER — OFLOXACIN 0.3 % OP SOLN
1.0000 [drp] | Freq: Four times a day (QID) | OPHTHALMIC | Status: DC
  Administered 2017-03-02 (×4): 1 [drp] via OPHTHALMIC
  Filled 2017-03-01: qty 5

## 2017-03-01 MED ORDER — ASPIRIN 325 MG PO TABS
ORAL_TABLET | ORAL | Status: AC | PRN
  Administered 2017-03-01: 325 mg

## 2017-03-01 MED ORDER — LACTATED RINGERS IV SOLN
INTRAVENOUS | Status: DC | PRN
  Administered 2017-03-01 (×2): via INTRAVENOUS

## 2017-03-01 MED ORDER — NITROGLYCERIN 1 MG/10 ML FOR IR/CATH LAB
INTRA_ARTERIAL | Status: AC
Start: 2017-03-01 — End: 2017-03-02
  Filled 2017-03-01: qty 10

## 2017-03-01 MED ORDER — SODIUM CHLORIDE 0.9 % IV BOLUS (SEPSIS)
1000.0000 mL | Freq: Once | INTRAVENOUS | Status: AC
  Administered 2017-03-01: 1000 mL via INTRAVENOUS

## 2017-03-01 MED ORDER — SODIUM CHLORIDE 0.9 % IV SOLN: INTRAVENOUS | Status: DC

## 2017-03-01 MED ORDER — CEFAZOLIN SODIUM-DEXTROSE 2-3 GM-%(50ML) IV SOLR
INTRAVENOUS | Status: DC | PRN
  Administered 2017-03-01: 2 g via INTRAVENOUS

## 2017-03-01 MED ORDER — IOPAMIDOL (ISOVUE-300) INJECTION 61%
INTRAVENOUS | Status: AC
  Administered 2017-03-02: 90 mL
  Filled 2017-03-01: qty 50

## 2017-03-01 MED ORDER — LEVOTHYROXINE SODIUM 112 MCG PO TABS
112.0000 ug | ORAL_TABLET | Freq: Every day | ORAL | Status: DC
  Administered 2017-03-02 – 2017-03-03 (×2): 112 ug via ORAL
  Filled 2017-03-01 (×2): qty 1

## 2017-03-01 MED ORDER — EPTIFIBATIDE 20 MG/10ML IV SOLN
INTRAVENOUS | Status: AC | PRN
  Administered 2017-03-01 (×2): 1 mg via INTRAVENOUS
  Administered 2017-03-01 (×4): 1.5 mg via INTRAVENOUS

## 2017-03-01 MED ORDER — ASPIRIN 81 MG PO CHEW
CHEWABLE_TABLET | ORAL | Status: AC
  Filled 2017-03-01: qty 1

## 2017-03-01 MED ORDER — SENNOSIDES-DOCUSATE SODIUM 8.6-50 MG PO TABS: 1.0000 | ORAL_TABLET | Freq: Every evening | ORAL | Status: DC | PRN

## 2017-03-01 MED ORDER — CEFAZOLIN SODIUM-DEXTROSE 2-4 GM/100ML-% IV SOLN
INTRAVENOUS | Status: AC
  Filled 2017-03-01: qty 100

## 2017-03-01 MED ORDER — PROPOFOL 500 MG/50ML IV EMUL
INTRAVENOUS | Status: DC | PRN
  Administered 2017-03-01: 25 ug/kg/min via INTRAVENOUS

## 2017-03-01 MED ORDER — ACETAMINOPHEN 160 MG/5ML PO SOLN: 650.0000 mg | ORAL | Status: DC | PRN

## 2017-03-01 MED ORDER — HEPARIN SODIUM (PORCINE) 1000 UNIT/ML IJ SOLN
INTRAMUSCULAR | Status: DC | PRN
  Administered 2017-03-01: 1000 [IU] via INTRAVENOUS

## 2017-03-01 MED ORDER — TICAGRELOR 90 MG PO TABS
180.0000 mg | ORAL_TABLET | Freq: Once | ORAL | Status: DC
  Filled 2017-03-01 (×2): qty 2

## 2017-03-01 MED ORDER — IOPAMIDOL (ISOVUE-300) INJECTION 61%
INTRAVENOUS | Status: AC
  Administered 2017-03-02: 90 mL
  Filled 2017-03-01: qty 100

## 2017-03-01 MED ORDER — PROPOFOL 10 MG/ML IV BOLUS
INTRAVENOUS | Status: AC
  Filled 2017-03-01: qty 20

## 2017-03-01 MED ORDER — TICAGRELOR 60 MG PO TABS
ORAL_TABLET | ORAL | Status: AC | PRN
  Administered 2017-03-01: 180 mg

## 2017-03-01 MED ORDER — GLYCOPYRROLATE 0.2 MG/ML IJ SOLN
INTRAMUSCULAR | Status: DC | PRN
  Administered 2017-03-01 (×3): 0.2 mg via INTRAVENOUS

## 2017-03-01 MED ORDER — ROCURONIUM BROMIDE 100 MG/10ML IV SOLN
INTRAVENOUS | Status: DC | PRN
Start: 2017-03-01 — End: 2017-03-02
  Administered 2017-03-01 (×4): 50 mg via INTRAVENOUS

## 2017-03-01 MED ORDER — STROKE: EARLY STAGES OF RECOVERY BOOK
Freq: Once | Status: DC
  Filled 2017-03-01: qty 1

## 2017-03-01 MED ORDER — PREDNISOLONE ACETATE 1 % OP SUSP
1.0000 [drp] | Freq: Four times a day (QID) | OPHTHALMIC | Status: DC
  Administered 2017-03-02 (×4): 1 [drp] via OPHTHALMIC
  Filled 2017-03-01: qty 1

## 2017-03-01 MED ORDER — PHENYLEPHRINE HCL 10 MG/ML IJ SOLN
INTRAMUSCULAR | Status: DC | PRN
  Administered 2017-03-01: 80 ug via INTRAVENOUS

## 2017-03-01 MED ORDER — SUFENTANIL CITRATE 50 MCG/ML IV SOLN
INTRAVENOUS | Status: AC
  Filled 2017-03-01: qty 1

## 2017-03-01 MED ORDER — ASPIRIN 300 MG RE SUPP
300.0000 mg | Freq: Once | RECTAL | Status: AC
  Administered 2017-03-01: 300 mg via RECTAL
  Filled 2017-03-01: qty 1

## 2017-03-01 MED ORDER — IOPAMIDOL (ISOVUE-370) INJECTION 76%
INTRAVENOUS | Status: AC
  Administered 2017-03-01: 100 mL
  Filled 2017-03-01: qty 100

## 2017-03-01 MED ORDER — ACETAMINOPHEN 650 MG RE SUPP: 650.0000 mg | RECTAL | Status: DC | PRN

## 2017-03-01 MED ORDER — KETOROLAC TROMETHAMINE 0.5 % OP SOLN
1.0000 [drp] | Freq: Four times a day (QID) | OPHTHALMIC | Status: DC
  Administered 2017-03-02 (×4): 1 [drp] via OPHTHALMIC
  Filled 2017-03-01: qty 3

## 2017-03-01 MED ORDER — LIDOCAINE HCL (CARDIAC) 20 MG/ML IV SOLN
INTRAVENOUS | Status: DC | PRN
  Administered 2017-03-01: 100 mg via INTRATRACHEAL

## 2017-03-01 MED ORDER — ACETAMINOPHEN 325 MG PO TABS: 650.0000 mg | ORAL_TABLET | ORAL | Status: DC | PRN

## 2017-03-01 MED ORDER — SUCCINYLCHOLINE CHLORIDE 20 MG/ML IJ SOLN
INTRAMUSCULAR | Status: DC | PRN
  Administered 2017-03-01: 100 mg via INTRAVENOUS

## 2017-03-01 NOTE — Sedation Documentation (Addendum)
Reopro 4mg given IA per Dr. Deveshwar 

## 2017-03-01 NOTE — Anesthesia Procedure Notes (Signed)
Arterial Line Insertion Start/End01/16/2018 7:30 PM, 04/25/2016 7:33 PM Performed by: Melina SchoolsBanks, Denishia Citro J, CRNA, CRNA  Patient location: OR. Preanesthetic checklist: patient identified and IV checked Patient sedated Left, radial was placed Catheter size: 20 G Hand hygiene performed  and maximum sterile barriers used   Attempts: 1 Procedure performed without using ultrasound guided technique. Following insertion, dressing applied (After aline obtained left radial surgeon notified of left subclavian occulsion). Patient tolerated the procedure well with no immediate complications.

## 2017-03-01 NOTE — Progress Notes (Signed)
Patient ID: Haley HammingKaren V Montgomery, female   DOB: 1956/01/17, 61 y.o.   MRN: [id number] INR. 61 year old RY handed female last seen well on Tuesday at 9 am. Since then has had a progressively deteriorating course neurologically to today when she was found to on the  groundin her bedroom.at 10 am. pROMORBID RANKIN SCORE 0. NIH ? 20. CT brain NO ICH but with lt basal ganglia and Lt post parietal evolving infarcts. CTA occluded LT ICA prox. Clot in Lt MCA ? Minimal collaterals from RT ICA vis ACOM  CTP CBF < 30 vol 10ml.Tmax> 6.0 s vol38823ml Mismatch vol 313ml  Mismatch ratio 32.3. Given the patients age and the large penumbra noted on the CTP studies and a relatively small core , The option of endovascular  Revascularization of the left ICA prox and possibly the Lt MCA was discussed with the patients legal medical power of attorney. The procedure ,risks alternatives were reviewed in detail. Risks of ICH 10 to 15 % ,worsening neurological function ,vent dependency,inability to revascularize and death were all reviewed in detail.Marland Kitchen. Power of attorney was specifically made aware of the fact that the patient was outside  the 24 hour window of treatment as per recent trials,and therefore results of the trials may not be totally applicable  To the patient.Marland Kitchen. He expressed full understanding, and wants to proceed with the endovascular treatment underGA. Informed witnessed consent obtained.Marland Kitchen. S.Jearld Hemp MD

## 2017-03-01 NOTE — ED Notes (Signed)
Pt taken to IR bedside report given.

## 2017-03-01 NOTE — ED Notes (Addendum)
MD Moreen FowlerLindzen Gave verbal order for 1 L NS Bolus. MD Otelia LimesLindzen requested visitor at bedside go and get power of attorney papers that he states he does have. Patient is unable to consent for emergent IR procedure and only family member is at bedside is 61 years of age. MD Otelia LimesLindzen states to page him when friend is back with papers.

## 2017-03-01 NOTE — ED Notes (Signed)
Made copy of POA and placed in medical records.

## 2017-03-01 NOTE — ED Notes (Signed)
Dwayne Okey DupreCrawford arrives back with POA paper work that states is is authorized to make healthcare decisions for patient when she is unable to, ID verified. MD Otelia LimesLindzen is speaking on phone with Mr. Okey DupreCrawford at this time. MD Otelia LimesLindzen stated to me He would call IR team if consent was given by Mr. Crawford.

## 2017-03-01 NOTE — ED Notes (Addendum)
Consent filled out by this RN and deveshwar and signed by POA.

## 2017-03-01 NOTE — Sedation Documentation (Signed)
Reopro 4mg  given IA per Dr. Corliss Skainseveshwar

## 2017-03-01 NOTE — Sedation Documentation (Signed)
Reopro 2mg  given IA per Dr. Corliss Skainseveshwar

## 2017-03-01 NOTE — ED Notes (Signed)
MD Otelia LimesLindzen paged friend back with papers.

## 2017-03-01 NOTE — Procedures (Signed)
S/P bilateral common carotid and RT vertebral arteriograms. RT CFA approach. Findings. 1.Occluded LT ICA  prox and at the terminus with occlusion of the Lt MCA prox. Revascularization  Of Lt ICA prox  With stent assisted angioplasty and rescue pharmacological thrombolysis of of acute platelet aggregation within the stents  With 10 mg of IA REOPRO and  8.5mg  of INTEGRELIN.. 2.S/P x passes with solitaire 4mm x 40 mm retrieval device of the occluded Lt MCA  with no sig recanalization.

## 2017-03-01 NOTE — ED Notes (Signed)
Pt back from CT, unable to per perfusion or angio until seen by neuro per Ct tech. MD made aware states waiting to hear back from MD Lindzen.

## 2017-03-01 NOTE — ED Notes (Signed)
Disregard previous discharge note.

## 2017-03-01 NOTE — ED Notes (Signed)
ED Provider at bedside. 

## 2017-03-01 NOTE — Anesthesia Procedure Notes (Signed)
Procedure Name: Intubation Date/Time: 02/10/2017 7:26 PM Performed by: Claris Che, CRNA Pre-anesthesia Checklist: Patient identified, Emergency Drugs available, Suction available, Patient being monitored and Timeout performed Patient Re-evaluated:Patient Re-evaluated prior to induction Oxygen Delivery Method: Circle system utilized Preoxygenation: Pre-oxygenation with 100% oxygen Induction Type: IV induction, Rapid sequence and Cricoid Pressure applied Laryngoscope Size: Mac and 3 Grade View: Grade II Tube type: Subglottic suction tube Tube size: 7.5 mm Number of attempts: 1 Airway Equipment and Method: Stylet Placement Confirmation: ETT inserted through vocal cords under direct vision,  positive ETCO2 and breath sounds checked- equal and bilateral Secured at: 22 cm Tube secured with: Tape Dental Injury: Teeth and Oropharynx as per pre-operative assessment

## 2017-03-01 NOTE — ED Notes (Signed)
Family at bedside. 

## 2017-03-01 NOTE — ED Notes (Signed)
MD Lindzen at bedside.  

## 2017-03-01 NOTE — ED Notes (Signed)
Pt transported to CT ?

## 2017-03-01 NOTE — Consult Note (Signed)
Admission History and Physical - Neurology Service  Referring Physician: Dr. Adela LankFloyd    Chief Complaint: Slurred speech, left gaze preference and altered gait  HPI: Haley Montgomery is an 61 y.o. female who arrived at the ED via EMS with slurred speech, left gaze preference and altered gait. She lives with her granddaughter who states that the patient's LKN was 9 AM on Tuesday. Later that day, when granddaughter returned from class, she noted the patient to have slurred speech, left gaze and altered gait that afternoon when she came home from class. Subsequently, today at 10 AM, the granddaughter found patient on the ground in her bedroom; granddaughter asked her if she was going to get up and the patient nodded yes. Granddaughter checked on her again this afternoon and patient was unable to move, so EMS was called. Right sided paralysis was noted on EMS arrival, in addition to aphasia and left side gaze deviation.   CT head:  1. Recent appearing small infarcts in the left parietal cortex (posterior MCA watershed) and left basal ganglia (lateral lenticulostriate). CTA and cerebral perfusion are pending. 2. Small remote left cerebellar infarcts. Presumed chronic small vessel ischemic in the cerebral white matter.  CTA head and neck shows complete occlusion of the left ICA with reconstitution within the cavernous segment, most likely via the left ophthalmic artery. Per discussion with Radiology, there also is likely to be right to left collateral flow via the ACOM.   CTP reveals hypoperfusion of the left cerebral hemisphere with a small infarct core.           Past Medical History:  Diagnosis Date  . ESSENTIAL HYPERTENSION 08/11/2009  . HYPERLIPIDEMIA 09/14/2008  . HYPOTHYROIDISM 09/14/2008  . MRI, BRAIN, ABNORMAL 08/16/2009    Past Surgical History:  Procedure Laterality Date  . THYROIDECTOMY  2002    Family History  Problem Relation Age of Onset  . Kidney disease Brother        PCKD  .  Diabetes Daughter        passed  . Cancer Maternal Grandfather        prostate  . Kidney disease Maternal Grandfather    Social History:  reports that she quit smoking about 14 years ago. Her smoking use included cigarettes. She has a 15.00 pack-year smoking history. she has never used smokeless tobacco. She reports that she drinks alcohol. She reports that she does not use drugs.  Allergies:  Allergies  Allergen Reactions  . Doxycycline Anaphylaxis    Sore throat, cough  . Olmesartan Medoxomil Other (See Comments)    Fainting spells (Benicar)    Home Medications:    ROS: Unable to obtain due to aphasia.   Physical Examination: Blood pressure (!) 170/72, pulse (!) 52, temperature 98.6 F (37 C), temperature source Rectal, resp. rate 18, SpO2 100 %.  HEENT: Right chin and jaw with cutaneous and subcutaneous rubor and turgor. Swollen lymph nodes versus submandibular glands palpated bilaterally.  Lungs: Respirations unlabored Ext: No edema  Neurologic Examination: Mental Status: Eyes open, awake and nonverbal. Will follow about 20% of simple motor commands. Does not fixate visually on visual stimuli. Dense right sided neglect.  Cranial Nerves: II:  Blinks to threat in right and left visual fields. PERRL.  III,IV, VI: Eyes deviated conjugately to the left of midline; no nystagmus. No ptosis.  V,VII: Right facial droop. Does not respond to tactile stimuli on the right. VIII: Hearing intact to about 20% of commands IX,X: Unable to visualize  palate. Does not vocalize.  XI: Head rotated to the left.  XII: Unable to assess Motor/Sensory: Right hemiplegia upper and lower extremity.  LUE: 4/5 spontaneous movement; grips hand with 4/5 strength to command LLE: 4/5 movement to noxious.  Deep Tendon Reflexes: Hypoactive right sided reflexes.  Cerebellar/Gait: Unable to assess   Results for orders placed or performed during the hospital encounter of 03-15-2017 (from the past 48 hour(s))   CBC     Status: Abnormal   Collection Time: 15-Mar-2017  3:03 PM  Result Value Ref Range   WBC 12.0 (H) 4.0 - 10.5 K/uL   RBC 5.19 (H) 3.87 - 5.11 MIL/uL   Hemoglobin 16.9 (H) 12.0 - 15.0 g/dL   HCT 40.9 (H) 81.1 - 91.4 %   MCV 93.8 78.0 - 100.0 fL   MCH 32.6 26.0 - 34.0 pg   MCHC 34.7 30.0 - 36.0 g/dL   RDW 78.2 95.6 - 21.3 %   Platelets 189 150 - 400 K/uL  Differential     Status: Abnormal   Collection Time: March 15, 2017  3:03 PM  Result Value Ref Range   Neutrophils Relative % 92 %   Neutro Abs 11.0 (H) 1.7 - 7.7 K/uL   Lymphocytes Relative 6 %   Lymphs Abs 0.7 0.7 - 4.0 K/uL   Monocytes Relative 2 %   Monocytes Absolute 0.3 0.1 - 1.0 K/uL   Eosinophils Relative 0 %   Eosinophils Absolute 0.0 0.0 - 0.7 K/uL   Basophils Relative 0 %   Basophils Absolute 0.0 0.0 - 0.1 K/uL  I-stat troponin, ED     Status: None   Collection Time: 03-15-2017  3:10 PM  Result Value Ref Range   Troponin i, poc 0.02 0.00 - 0.08 ng/mL   Comment 3            Comment: Due to the release kinetics of cTnI, a negative result within the first hours of the onset of symptoms does not rule out myocardial infarction with certainty. If myocardial infarction is still suspected, repeat the test at appropriate intervals.   I-Stat Chem 8, ED     Status: Abnormal   Collection Time: 2017-03-15  3:12 PM  Result Value Ref Range   Sodium 142 135 - 145 mmol/L   Potassium 4.4 3.5 - 5.1 mmol/L   Chloride 106 101 - 111 mmol/L   BUN 18 6 - 20 mg/dL   Creatinine, Ser 0.86 0.44 - 1.00 mg/dL   Glucose, Bld 578 (H) 65 - 99 mg/dL   Calcium, Ion 4.69 6.29 - 1.40 mmol/L   TCO2 25 22 - 32 mmol/L   Hemoglobin 16.7 (H) 12.0 - 15.0 g/dL   HCT 52.8 (H) 41.3 - 24.4 %   No results found.  Assessment: 61 y.o. 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 Extensive hypoperfusion of left hemisphere with small infarct core of 10 cc on perfusion study.  1. The patient is at risk for catastrophic infarction of her entire  left MCA territory. She is at high risk for major life altering disability or death.  2. Out of the 24 hour time window for endovascular treatment which has been investigated in clinical trials. Clinical trials can ascertain risk/benefit profile of treatment within a given time window, but generally cannot be used to rule out potential efficacy of treatment outside of the studied time window. CTP reveals a large core/penumbra mismatch and based upon imaging criteria, she is felt to be a good candidate for endovascular  intervention from a risk/benefit standpoint.  3. I have discussed risks/benefits with the patient's health care power of attorney. Risks of not proceeding with a clot extraction procedure include massive left hemisphere stroke resulting is severe disability and/or death. Risks of proceeding include, but are not restricted to, luxury perfusion with hemorrhage, subarachnoid hemorrhage, anesthesia complications, failure to reperfuse and arterial dissection. The patient's power of attorney expressed understanding of the risk/benefit profile presented to him and expressed that he is likely to approve treatment pending further discussion with Dr. Corliss Skainseveshwar.  4. Stroke Risk Factors - HTN and HLD 5. Mild fat stranding over the right clavicle, please correlate for clinical findings of contusion or cellulitis. 6. Hypothyroidism.   Plan: 1. Discussed case with Dr. Corliss Skainseveshwar. VIR team is being called in.  2. Following procedure, admit to ICU under Neurology service with post-procedure orders.  3. No antiplatelet medications or anticoagulants for at least 24 hours following endovascular procedure. DVT prophylaxis with SCDs.  4. Repeat CT head 24 hours following procedure 5. MRI of the brain without contrast and TTE when stable 6. Cardiac telemetry.  7. Frequent neuro checks 8. Will require CT of chest with contrast to further evaluate fat stranding over right clavicle which was seen on CTA of neck.    9. TSH. Continue home synthroid.   75 minutes spent in the emergent Neurological evaluation and management of this critically ill acute stroke patient.   @Electronically  signed: Dr. Caryl PinaEric Tandy Grawe@ 11-21-16, 3:43 PM

## 2017-03-01 NOTE — ED Notes (Addendum)
IR team paged and will transport patient to IR when any team member is there.

## 2017-03-01 NOTE — Anesthesia Preprocedure Evaluation (Addendum)
Anesthesia Evaluation  Patient identified by MRN, date of birth, ID band Patient confused and Patient unresponsive    Reviewed: Allergy & Precautions, NPO status , Patient's Chart, lab work & pertinent test results, Unable to perform ROS - Chart review onlyPreop documentation limited or incomplete due to emergent nature of procedure.  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  Reproductive/Obstetrics                            Anesthesia Physical Anesthesia Plan  ASA: IV and emergent  Anesthesia Plan: General   Post-op Pain Management:    Induction: Intravenous  PONV Risk Score and Plan: 3  Airway Management Planned: Oral ETT  Additional Equipment: Arterial line  Intra-op Plan:   Post-operative Plan: Post-operative intubation/ventilation  Informed Consent: I have reviewed the patients History and Physical, chart, labs and discussed the procedure including the risks, benefits and alternatives for the proposed anesthesia with the patient or authorized representative who has indicated his/her understanding and acceptance.   Only emergency history available and History available from chart only  Plan Discussed with: CRNA  Anesthesia Plan Comments: (CODE STROKE. PATIENT DIRECTLY TO IR SUITE. EMERGENT  ROS only.)        Anesthesia Quick Evaluation

## 2017-03-01 NOTE — ED Provider Notes (Signed)
[facility] EMERGENCY DEPARTMENT Provider Note   CSN: [id number] Arrival date & time: 03-16-2017  1457     History   Chief Complaint Chief Complaint  Patient presents with  . Stroke Symptoms    HPI Haley UNCAPHER is a 61 y.o. female.  HPI 61 year old female with a history of hypertension, HLD, hypothyroidism presenting with L sided gaze preference, R facial droop, aphasia, and R sided weakness.  She lives with her 81 year old granddaughter and the granddaughter states that she looked confused and was not talking much on Tuesday but was looking around appropriately.  She states that yesterday she slept most of the day and then at some point around midday she noticed that she was not using her right side and her eyes did not cross midline.  This morning she went to check on her and found her facedown on the ground.  She was breathing but was not responding and was only looking to the left.  Has not had any recent infectious symptoms other than a dry cough.  No vomiting or diarrhea.   Past Medical History:  Diagnosis Date  . ESSENTIAL HYPERTENSION 08/11/2009  . HYPERLIPIDEMIA 09/14/2008  . HYPOTHYROIDISM 09/14/2008  . MRI, BRAIN, ABNORMAL 08/16/2009    Patient Active Problem List   Diagnosis Date Noted  . 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 Physical exam 07/27/2016  . 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 Kidney stones 06/19/2012  . UTI (lower urinary tract infection) 02/20/2011  . ALLERGIC RHINITIS 01/20/2010  . MRI, BRAIN, ABNORMAL 08/16/2009  . BACK PAIN, LUMBAR 07/05/2009  . DIZZINESS 07/05/2009    Past Surgical History:  Procedure Laterality Date  . THYROIDECTOMY  2002    OB History    No data available       Home Medications    Prior to Admission medications   Medication Sig Start Date End Date Taking? Authorizing Provider  ketorolac (ACULAR) 0.4 % SOLN  02/13/17  Yes [provider]  ofloxacin (OCUFLOX) 0.3 % ophthalmic solution   02/12/17  Yes [provider]  prednisoLONE acetate (PRED FORTE) 1 % ophthalmic suspension  02/12/17  Yes [provider]  SYNTHROID 112 MCG tablet TAKE 1 TABLET(112 MCG) BY MOUTH DAILY BEFORE BREAKFAST 02/15/17  Yes Carlus Pavlov, MD  levothyroxine (SYNTHROID, LEVOTHROID) 100 MCG tablet Take daily Patient not taking: Reported on 2017-03-16 11/13/16   Carlus Pavlov, MD    Family History Family History  Problem Relation Age of Onset  . Kidney disease Brother        PCKD  . Diabetes Daughter        passed  . Cancer Maternal Grandfather        prostate  . Kidney disease Maternal Grandfather     Social History Social History   Tobacco Use  . Smoking status: Former Smoker    Packs/day: 0.50    Years: 30.00    Pack years: 15.00    Types: Cigarettes    Last attempt to quit: 06/17/2002    Years since quitting: 14.7  . Smokeless tobacco: Never Used  Substance Use Topics  . Alcohol use: Yes    Comment: glass of wine occasional  . Drug use: No     Allergies   Doxycycline and Olmesartan medoxomil   Review of Systems Review of Systems  Unable to perform ROS: Mental status change     Physical Exam Updated Vital Signs BP 116/63   Pulse 72   Temp 98.6 F (37 C) (  Rectal)   Resp 19   SpO2 100%   Physical Exam  Constitutional: She appears well-developed and well-nourished.  HENT:  Head: Normocephalic and atraumatic.  Eyes: Conjunctivae are normal. Pupils are equal, round, and reactive to light.  L sided gaze preference.  Neck: Trachea normal. Neck supple. No neck rigidity. No tracheal deviation present.  Cardiovascular: Normal rate, regular rhythm, S1 normal, S2 normal, normal heart sounds, intact distal pulses and normal pulses. Exam reveals no gallop and no friction rub.  No murmur heard. Pulmonary/Chest: Effort normal and breath sounds normal. No respiratory distress. She has no decreased breath sounds. She has no rhonchi.  Abdominal: Soft. She  exhibits no distension. There is no tenderness.  Neurological: She is alert. She is disoriented.  Reflex Scores:      Patellar reflexes are 1+ on the right side and 1+ on the left side. L sided gaze preference, R sided facial droop, Aphasic. 0/5 strength in RUE and not responding to pain. 2/5 strength in RLE, responds to pain. 4/5 strength in LUE and LLE.   Skin: Skin is warm.  Nursing note and vitals reviewed.    ED Treatments / Results  Labs (all labs ordered are listed, but only abnormal results are displayed) Labs Reviewed  CBC - Abnormal; Notable for the following components:      Result Value   WBC 12.0 (*)    RBC 5.19 (*)    Hemoglobin 16.9 (*)    HCT 48.7 (*)    All other components within normal limits  DIFFERENTIAL - Abnormal; Notable for the following components:   Neutro Abs 11.0 (*)    All other components within normal limits  COMPREHENSIVE METABOLIC PANEL - Abnormal; Notable for the following components:   Glucose, Bld 120 (*)    All other components within normal limits  URINALYSIS, ROUTINE W REFLEX MICROSCOPIC - Abnormal; Notable for the following components:   Specific Gravity, Urine >1.046 (*)    Hgb urine dipstick SMALL (*)    Ketones, ur 20 (*)    Protein, ur 30 (*)    All other components within normal limits  CK TOTAL AND CKMB (NOT [facility]) - Abnormal; Notable for the following components:   Total CK 504 (*)    CK, MB 11.7 (*)    All other components within normal limits  I-STAT CHEM 8, ED - Abnormal; Notable for the following components:   Glucose, Bld 119 (*)    Hemoglobin 16.7 (*)    HCT 49.0 (*)    All other components within normal limits  ETHANOL  PROTIME-INR  APTT  RAPID URINE DRUG SCREEN, HOSP PERFORMED  TSH  HIV ANTIBODY (ROUTINE TESTING)  HEMOGLOBIN A1C  LIPID PANEL  I-STAT TROPONIN, ED    EKG  EKG Interpretation  Date/Time:  Thursday March 01 2017 15:16:03 EST Ventricular Rate:  53 PR Interval:    QRS Duration: 138 QT  Interval:  499 QTC Calculation: 469 R Axis:   87 Text Interpretation:  Sinus rhythm Ventricular premature complex Right bundle branch block widened qrs from prior Otherwise no significant change Confirmed by Melene PlanFloyd, Dan [phone]([id number]) on 02/26/2017 3:23:08 PM       Radiology Ct Angio Head W Or Wo Contrast  Result Date: 02/16/2017 CLINICAL DATA:  Arterial stricture or occlusion. Slurred speech left base and altered gait. EXAM: CT ANGIOGRAPHY HEAD AND NECK CT PERFUSION BRAIN TECHNIQUE: Multidetector CT imaging of the head and neck was performed using the standard protocol during bolus administration  of intravenous contrast. Multiplanar CT image reconstructions and MIPs were obtained to evaluate the vascular anatomy. Carotid stenosis measurements (when applicable) are obtained utilizing NASCET criteria, using the distal internal carotid diameter as the denominator. Multiphase CT imaging of the brain was performed following IV bolus contrast injection. Subsequent parametric perfusion maps were calculated using RAPID software. CONTRAST:  ISOVUE-370 IOPAMIDOL (ISOVUE-370) INJECTION 76% COMPARISON:  Brain MRI 08/13/2009 FINDINGS: CTA NECK FINDINGS Aortic arch: Atheromatous plaque. No acute finding or visible dilatation. Right carotid system: Atheromatous wall thickening of the brachiocephalic. Calcified and noncalcified plaque at the common carotid bifurcation and ICA bulb without flow limiting stenosis or ulceration. Left carotid system: Atheromatous plaque at the common carotid origin and distal segment. There is calcified plaque at the ICA bulb with occlusion continuing throughout the neck. Good ECA patency. A left occipital collateral servers the left vertebral artery that is flowing retrograde. Vertebral arteries: Proximal left subclavian occlusion. There is less intense flow into the left subclavian and lower vertebral artery, steal phenomenon. Atherosclerosis of the brachiocephalic and right subclavian  artery without flow limiting stenosis. There is a focal high-grade narrowing of the right V1 segment. Skeleton: No acute or aggressive finding. Other neck: Thyroidectomy.  No acute finding. Upper chest: Centrilobular micro nodularity, suspect this is smoking-related. There is stranding over the right upper chest wall fat, nonspecific. Review of the MIP images confirms the above findings CTA HEAD FINDINGS Anterior circulation: The left ICA reconstitutes at the proximal cavernous segment. There is less intense opacification of the left ICA and downstream vessels. At tiny anterior communicating artery is present on coronal reformats. The left PCA is fetal type, as below. Proximal left M2 branch stenosis. No MCA or ACA proximal branch occlusion is seen. Atherosclerosis of the right carotid siphon. No right-sided branch occlusion is seen. Posterior circulation: Right dominant vertebral artery. Minimal flow seen in the left V4 segment, essentially occluded at the dura. The left vertebral artery reconstitutes from the occipital artery of the ECA. The basilar is small in the setting of bilateral fetal type PCA. There is superimposed advanced tandem narrowing of the left P2 segment with poor downstream flow. Venous sinuses: As permitted by contrast timing, patent. Anatomic variants: As above Delayed phase: No abnormal intracranial enhancement. Review of the MIP images confirms the above findings CT Brain Perfusion Findings: CBF (<30%) Volume: 10mL. This is underestimated as parietal cortex and left basal ganglia infarcts are not included. Perfusion (Tmax>6.0s) volume: . Left ACA, MCA, and PCA distributions are diffusely involved given the essentially isolated left ICA and fetal type left PCA. Mismatch Volume: Overestimated as above. Infarction Location:Left parietal white matter by perfusion. Left parietal cortex and basal ganglia by noncontrast CT. These results were called by telephone at the time of interpretation on  03-23-17 at 4:31 pm to Dr. Melene Plan , who verbally acknowledged these results. IMPRESSION: 1. 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 The left ICA is nearly isolated from the circle-of-Willis given a tiny anterior communicating artery and fetal type left PCA that has superimposed tandem high-grade stenoses. There is underfilling of vessels to the left cerebral hemisphere with ischemia/delayed flow by perfusion. Cerebral perfusion estimates a 10 cc left parietal white matter infarct that is underestimated - there is left parietal cortex and left lateral lenticulostriate infarcts by noncontrast CT. 2. Left proximal subclavian occlusion with less intense/delayed downstream opacification and implied retrograde flow in the left vertebral artery. The left vertebral artery is essentially occluded at the V4  segment and this flow is retrograde via an occipital collateral. 3. High-grade proximal right V1 segment stenosis. 4. Mild fat stranding over the right clavicle, please correlate for clinical findings of contusion or cellulitis. Electronically Signed   By: Marnee SpringJonathon  Watts M.D.   On: 02/11/2017 16:50   Ct Head Wo Contrast  Result Date: 03/05/2017 CLINICAL DATA:  Slurred speech and left-sided gaze. Altered gait. Last seen normal on tuesday. EXAM: CT HEAD WITHOUT CONTRAST TECHNIQUE: Contiguous axial images were obtained from the base of the skull through the vertex without intravenous contrast. COMPARISON:  08/13/2009 FINDINGS: Brain: Cytotoxic type edema without volume loss seen in the left occipital parietal cortex, posterior MCA border zone territory. There is also a recent appearing infarct in the left basal ganglia and corona radiata. Patchy chronic microvascular ischemic type change in the cerebral white matter. Small remote left cerebellar infarcts. No hemorrhage, hydrocephalus, or masslike finding. Vascular: No hyperdense vessel.  Atherosclerotic calcification. Skull: Normal.  Negative for fracture or focal lesion. Sinuses/Orbits: Negative IMPRESSION: 1. Recent appearing small infarcts in the left parietal cortex (posterior MCA watershed) and left basal ganglia (lateral lenticulostriate). CTA and cerebral perfusion or pending. 2. Small remote left cerebellar infarcts. Presumed chronic small vessel ischemic in the cerebral white matter. Electronically Signed   By: Marnee SpringJonathon  Watts M.D.   On: 02/22/2017 16:12   Ct Angio Neck W And/or Wo Contrast  Result Date: 02/12/2017 CLINICAL DATA:  Arterial stricture or occlusion. Slurred speech left base and altered gait. EXAM: CT ANGIOGRAPHY HEAD AND NECK CT PERFUSION BRAIN TECHNIQUE: Multidetector CT imaging of the head and neck was performed using the standard protocol during bolus administration of intravenous contrast. Multiplanar CT image reconstructions and MIPs were obtained to evaluate the vascular anatomy. Carotid stenosis measurements (when applicable) are obtained utilizing NASCET criteria, using the distal internal carotid diameter as the denominator. Multiphase CT imaging of the brain was performed following IV bolus contrast injection. Subsequent parametric perfusion maps were calculated using RAPID software. CONTRAST:  100mL ISOVUE-370 IOPAMIDOL (ISOVUE-370) INJECTION 76% COMPARISON:  Brain MRI 08/13/2009 FINDINGS: CTA NECK FINDINGS Aortic arch: Atheromatous plaque. No acute finding or visible dilatation. Right carotid system: Atheromatous wall thickening of the brachiocephalic. Calcified and noncalcified plaque at the common carotid bifurcation and ICA bulb without flow limiting stenosis or ulceration. Left carotid system: Atheromatous plaque at the common carotid origin and distal segment. There is calcified plaque at the ICA bulb with occlusion continuing throughout the neck. Good ECA patency. A left occipital collateral servers the left vertebral artery that is flowing retrograde. Vertebral arteries: Proximal left subclavian  occlusion. There is less intense flow into the left subclavian and lower vertebral artery, steal phenomenon. Atherosclerosis of the brachiocephalic and right subclavian artery without flow limiting stenosis. There is a focal high-grade narrowing of the right V1 segment. Skeleton: No acute or aggressive finding. Other neck: Thyroidectomy.  No acute finding. Upper chest: Centrilobular micro nodularity, suspect this is smoking-related. There is stranding over the right upper chest wall fat, nonspecific. Review of the MIP images confirms the above findings CTA HEAD FINDINGS Anterior circulation: The left ICA reconstitutes at the proximal cavernous segment. There is less intense opacification of the left ICA and downstream vessels. At tiny anterior communicating artery is present on coronal reformats. The left PCA is fetal type, as below. Proximal left M2 branch stenosis. No MCA or ACA proximal branch occlusion is seen. Atherosclerosis of the right carotid siphon. No right-sided branch occlusion is seen. Posterior circulation: Right dominant vertebral  artery. Minimal flow seen in the left V4 segment, essentially occluded at the dura. The left vertebral artery reconstitutes from the occipital artery of the ECA. The basilar is small in the setting of bilateral fetal type PCA. There is superimposed advanced tandem narrowing of the left P2 segment with poor downstream flow. Venous sinuses: As permitted by contrast timing, patent. Anatomic variants: As above Delayed phase: No abnormal intracranial enhancement. Review of the MIP images confirms the above findings CT Brain Perfusion Findings: CBF (<30%) Volume: 10mL. This is underestimated as parietal cortex and left basal ganglia infarcts are not included. Perfusion (Tmax>6.0s) volume: . Left ACA, MCA, and PCA distributions are diffusely involved given the essentially isolated left ICA and fetal type left PCA. Mismatch Volume: Overestimated as above. Infarction  Location:Left parietal white matter by perfusion. Left parietal cortex and basal ganglia by noncontrast CT. These results were called by telephone at the time of interpretation on 03/07/2017 at 4:31 pm to Dr. Melene Plan , who verbally acknowledged these results. IMPRESSION: 1. 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 The left ICA is nearly isolated from the circle-of-Willis given a tiny anterior communicating artery and fetal type left PCA that has superimposed tandem high-grade stenoses. There is underfilling of vessels to the left cerebral hemisphere with ischemia/delayed flow by perfusion. Cerebral perfusion estimates a 10 cc left parietal white matter infarct that is underestimated - there is left parietal cortex and left lateral lenticulostriate infarcts by noncontrast CT. 2. Left proximal subclavian occlusion with less intense/delayed downstream opacification and implied retrograde flow in the left vertebral artery. The left vertebral artery is essentially occluded at the V4 segment and this flow is retrograde via an occipital collateral. 3. High-grade proximal right V1 segment stenosis. 4. Mild fat stranding over the right clavicle, please correlate for clinical findings of contusion or cellulitis. Electronically Signed   By: Marnee Spring M.D.   On: 02/22/2017 16:50   Ct Cerebral Perfusion W Contrast  Result Date: 03/02/2017 CLINICAL DATA:  Arterial stricture or occlusion. Slurred speech left base and altered gait. EXAM: CT ANGIOGRAPHY HEAD AND NECK CT PERFUSION BRAIN TECHNIQUE: Multidetector CT imaging of the head and neck was performed using the standard protocol during bolus administration of intravenous contrast. Multiplanar CT image reconstructions and MIPs were obtained to evaluate the vascular anatomy. Carotid stenosis measurements (when applicable) are obtained utilizing NASCET criteria, using the distal internal carotid diameter as the denominator. Multiphase CT  imaging of the brain was performed following IV bolus contrast injection. Subsequent parametric perfusion maps were calculated using RAPID software. CONTRAST:  ISOVUE-370 IOPAMIDOL (ISOVUE-370) INJECTION 76% COMPARISON:  Brain MRI 08/13/2009 FINDINGS: CTA NECK FINDINGS Aortic arch: Atheromatous plaque. No acute finding or visible dilatation. Right carotid system: Atheromatous wall thickening of the brachiocephalic. Calcified and noncalcified plaque at the common carotid bifurcation and ICA bulb without flow limiting stenosis or ulceration. Left carotid system: Atheromatous plaque at the common carotid origin and distal segment. There is calcified plaque at the ICA bulb with occlusion continuing throughout the neck. Good ECA patency. A left occipital collateral servers the left vertebral artery that is flowing retrograde. Vertebral arteries: Proximal left subclavian occlusion. There is less intense flow into the left subclavian and lower vertebral artery, steal phenomenon. Atherosclerosis of the brachiocephalic and right subclavian artery without flow limiting stenosis. There is a focal high-grade narrowing of the right V1 segment. Skeleton: No acute or aggressive finding. Other neck: Thyroidectomy.  No acute finding. Upper chest: Centrilobular micro  nodularity, suspect this is smoking-related. There is stranding over the right upper chest wall fat, nonspecific. Review of the MIP images confirms the above findings CTA HEAD FINDINGS Anterior circulation: The left ICA reconstitutes at the proximal cavernous segment. There is less intense opacification of the left ICA and downstream vessels. At tiny anterior communicating artery is present on coronal reformats. The left PCA is fetal type, as below. Proximal left M2 branch stenosis. No MCA or ACA proximal branch occlusion is seen. Atherosclerosis of the right carotid siphon. No right-sided branch occlusion is seen. Posterior circulation: Right dominant vertebral  artery. Minimal flow seen in the left V4 segment, essentially occluded at the dura. The left vertebral artery reconstitutes from the occipital artery of the ECA. The basilar is small in the setting of bilateral fetal type PCA. There is superimposed advanced tandem narrowing of the left P2 segment with poor downstream flow. Venous sinuses: As permitted by contrast timing, patent. Anatomic variants: As above Delayed phase: No abnormal intracranial enhancement. Review of the MIP images confirms the above findings CT Brain Perfusion Findings: CBF (<30%) Volume: 10mL. This is underestimated as parietal cortex and left basal ganglia infarcts are not included. Perfusion (Tmax>6.0s) volume: . Left ACA, MCA, and PCA distributions are diffusely involved given the essentially isolated left ICA and fetal type left PCA. Mismatch Volume: Overestimated as above. Infarction Location:Left parietal white matter by perfusion. Left parietal cortex and basal ganglia by noncontrast CT. These results were called by telephone at the time of interpretation on 03/25/2017 at 4:31 pm to Dr. Melene Plan , who verbally acknowledged these results. IMPRESSION: 1. 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 The left ICA is nearly isolated from the circle-of-Willis given a tiny anterior communicating artery and fetal type left PCA that has superimposed tandem high-grade stenoses. There is underfilling of vessels to the left cerebral hemisphere with ischemia/delayed flow by perfusion. Cerebral perfusion estimates a 10 cc left parietal white matter infarct that is underestimated - there is left parietal cortex and left lateral lenticulostriate infarcts by noncontrast CT. 2. Left proximal subclavian occlusion with less intense/delayed downstream opacification and implied retrograde flow in the left vertebral artery. The left vertebral artery is essentially occluded at the V4 segment and this flow is retrograde via an  occipital collateral. 3. High-grade proximal right V1 segment stenosis. 4. Mild fat stranding over the right clavicle, please correlate for clinical findings of contusion or cellulitis. Electronically Signed   By: Marnee Spring M.D.   On: 03-25-2017 16:50    Procedures Procedures (including critical care time)  Medications Ordered in ED Medications  ketorolac (ACULAR) 0.4 % ophthalmic solution 1 drop (not administered)  ofloxacin (OCUFLOX) 0.3 % ophthalmic solution 1 drop (not administered)  prednisoLONE acetate (PRED FORTE) 1 % ophthalmic suspension 1 drop (not administered)  levothyroxine (SYNTHROID, LEVOTHROID) tablet 112 mcg (not administered)   stroke: mapping our early stages of recovery book (not administered)  0.9 %  sodium chloride infusion (not administered)  acetaminophen (TYLENOL) tablet 650 mg (not administered)    Or  acetaminophen (TYLENOL) solution 650 mg (not administered)    Or  acetaminophen (TYLENOL) suppository 650 mg (not administered)  senna-docusate (Senokot-S) tablet 1 tablet (not administered)  aspirin 325 MG tablet (not administered)  nitroGLYCERIN 100 MCG/ML intra-arterial injection (not administered)  aspirin 81 MG chewable tablet (not administered)  iopamidol (ISOVUE-300) 61 % injection (not administered)  ticagrelor (BRILINTA) tablet 180 mg (not administered)  aspirin suppository 300 mg (300 mg Rectal  Given 03/09/2017 1624)  iopamidol (ISOVUE-370) 76 % injection (100 mLs  Contrast Given 02/14/2017 1556)  sodium chloride 0.9 % bolus 1,000 mL (0 mLs Intravenous Stopped 02/13/2017 1841)     Initial Impression / Assessment and Plan / ED Course  I have reviewed the triage vital signs and the nursing notes.  Pertinent labs & imaging results that were available during my care of the patient were reviewed by me and considered in my medical decision making (see chart for details).     61 year old female presenting with left gaze preference, right facial droop,  aphasia, right-sided weakness.  On arrival, ABCs intact.  Afebrile HD stable.  Concern for acute stroke.  No infectious symptoms per the family.  Patient has a history of hypothyroidism, will order TSH along with the stroke order set. CT head shows no acute hemorrhage.  Patient discussed with neurology and they suggest the CT angiogram and perfusion study. Labs otherwise grossly unremarkable other than a mild elevated CK and a small leukocytosis that is likely reactive.  Perfusion study shows a left ICA occlusion.  Neurology will discuss the patient with IR for possible thrombectomy.  Will admit to neurology service in the ICU.  She remained hemodynamically stable in the emergency department.  Final Clinical Impressions(s) / ED Diagnoses   Final diagnoses:  Stroke (cerebrum) [place] [facility])    ED Discharge Orders    None       Keron Neenan [place], MD 02/28/2017 1936    Melene Plan, DO 02/22/2017 2023

## 2017-03-01 NOTE — ED Triage Notes (Signed)
Pt arrives from home via [facility], pts granddaughter states pts LSN was 8am Tuesday, g-daughter reports pt had slurred speech, left gaze and altered gait that afternoon when she came home from class.pt was found on the ground in her bedroom this am around 10, when asked by granddaughter if she was going to get up pt nodded yes, upon granddaughter checking on her again this afternoon pt was unable to move and ems was called. Right side paralysis upon ems arrival, pt aphasic, left sided gaze. resp e/u.

## 2017-03-01 NOTE — ED Notes (Addendum)
MD DEVESEHWAR at bedside to explain IR procedure risk and benefits.

## 2017-03-02 ENCOUNTER — Inpatient Hospital Stay (HOSPITAL_COMMUNITY): Payer: BLUE CROSS/BLUE SHIELD

## 2017-03-02 ENCOUNTER — Encounter (HOSPITAL_COMMUNITY): Payer: Self-pay | Admitting: Radiology

## 2017-03-02 ENCOUNTER — Other Ambulatory Visit: Payer: Self-pay

## 2017-03-02 DIAGNOSIS — E785 Hyperlipidemia, unspecified: Secondary | ICD-10-CM

## 2017-03-02 DIAGNOSIS — I6522 Occlusion and stenosis of left carotid artery: Secondary | ICD-10-CM

## 2017-03-02 DIAGNOSIS — Z978 Presence of other specified devices: Secondary | ICD-10-CM

## 2017-03-02 DIAGNOSIS — G458 Other transient cerebral ischemic attacks and related syndromes: Secondary | ICD-10-CM

## 2017-03-02 DIAGNOSIS — I361 Nonrheumatic tricuspid (valve) insufficiency: Secondary | ICD-10-CM

## 2017-03-02 DIAGNOSIS — J9600 Acute respiratory failure, unspecified whether with hypoxia or hypercapnia: Secondary | ICD-10-CM

## 2017-03-02 DIAGNOSIS — I639 Cerebral infarction, unspecified: Secondary | ICD-10-CM

## 2017-03-02 DIAGNOSIS — I63232 Cerebral infarction due to unspecified occlusion or stenosis of left carotid arteries: Secondary | ICD-10-CM

## 2017-03-02 DIAGNOSIS — J95821 Acute postprocedural respiratory failure: Secondary | ICD-10-CM

## 2017-03-02 DIAGNOSIS — I161 Hypertensive emergency: Secondary | ICD-10-CM

## 2017-03-02 DIAGNOSIS — I708 Atherosclerosis of other arteries: Secondary | ICD-10-CM

## 2017-03-02 HISTORY — PX: IR ANGIO INTRA EXTRACRAN SEL COM CAROTID INNOMINATE UNI R MOD SED: IMG5359

## 2017-03-02 HISTORY — PX: IR PERCUTANEOUS ART THROMBECTOMY/INFUSION INTRACRANIAL INC DIAG ANGIO: IMG6087

## 2017-03-02 HISTORY — PX: IR ANGIO VERTEBRAL SEL SUBCLAVIAN INNOMINATE UNI R MOD SED: IMG5365

## 2017-03-02 HISTORY — PX: IR INTRAVSC STENT CERV CAROTID W/O EMB-PROT MOD SED INC ANGIO: IMG2304

## 2017-03-02 LAB — TROPONIN I: TROPONIN I: 0.04 ng/mL — AB (ref <0.03)

## 2017-03-02 LAB — CBC WITH DIFFERENTIAL/PLATELET
Basophils Absolute: 0 10*3/uL (ref 0.0–0.1)
Basophils Relative: 0 %
Eosinophils Absolute: 0 10*3/uL (ref 0.0–0.7)
Eosinophils Relative: 0 %
HCT: 38.7 % (ref 36.0–46.0)
HEMOGLOBIN: 13.1 g/dL (ref 12.0–15.0)
LYMPHS ABS: 0.8 10*3/uL (ref 0.7–4.0)
LYMPHS PCT: 9 %
MCH: 31.8 pg (ref 26.0–34.0)
MCHC: 33.9 g/dL (ref 30.0–36.0)
MCV: 93.9 fL (ref 78.0–100.0)
Monocytes Absolute: 0.4 10*3/uL (ref 0.1–1.0)
Monocytes Relative: 4 %
NEUTROS ABS: 7.9 10*3/uL — AB (ref 1.7–7.7)
NEUTROS PCT: 87 %
Platelets: 166 10*3/uL (ref 150–400)
RBC: 4.12 MIL/uL (ref 3.87–5.11)
RDW: 13.5 % (ref 11.5–15.5)
WBC: 9 10*3/uL (ref 4.0–10.5)

## 2017-03-02 LAB — POCT I-STAT 7, (LYTES, BLD GAS, ICA,H+H)
ACID-BASE DEFICIT: 3 mmol/L — AB (ref 0.0–2.0)
Bicarbonate: 23.1 mmol/L (ref 20.0–28.0)
CALCIUM ION: 1.18 mmol/L (ref 1.15–1.40)
HCT: 36 % (ref 36.0–46.0)
Hemoglobin: 12.2 g/dL (ref 12.0–15.0)
O2 Saturation: 100 %
PH ART: 7.329 — AB (ref 7.350–7.450)
PO2 ART: 204 mmHg — AB (ref 83.0–108.0)
POTASSIUM: 3.7 mmol/L (ref 3.5–5.1)
SODIUM: 143 mmol/L (ref 135–145)
TCO2: 24 mmol/L (ref 22–32)
pCO2 arterial: 43.5 mmHg (ref 32.0–48.0)

## 2017-03-02 LAB — VAS US CAROTID
LCCADSYS: 76 cm/s
LEFT ECA DIAS: -21 cm/s
LEFT VERTEBRAL DIAS: 16 cm/s
Left CCA dist dias: 12 cm/s
Left CCA prox dias: 14 cm/s
Left CCA prox sys: 70 cm/s
Left ICA dist sys: 0 cm/s
Left ICA prox dias: -14 cm/s
Left ICA prox sys: -78 cm/s
RCCADSYS: -73 cm/s
RCCAPDIAS: 26 cm/s
RIGHT ECA DIAS: -9 cm/s
RIGHT VERTEBRAL DIAS: 24 cm/s
Right CCA prox sys: 107 cm/s

## 2017-03-02 LAB — MAGNESIUM
MAGNESIUM: 1.8 mg/dL (ref 1.7–2.4)
MAGNESIUM: 1.9 mg/dL (ref 1.7–2.4)

## 2017-03-02 LAB — GLUCOSE, CAPILLARY
GLUCOSE-CAPILLARY: 92 mg/dL (ref 65–99)
Glucose-Capillary: 90 mg/dL (ref 65–99)
Glucose-Capillary: 95 mg/dL (ref 65–99)
Glucose-Capillary: 114 mg/dL — ABNORMAL HIGH (ref 65–99)

## 2017-03-02 LAB — LIPID PANEL
CHOLESTEROL: 194 mg/dL (ref 0–200)
HDL: 36 mg/dL — ABNORMAL LOW (ref >40)
LDL CALC: 131 mg/dL — AB (ref 0–99)
TRIGLYCERIDES: 133 mg/dL (ref <150)
Total CHOL/HDL Ratio: 5.4 RATIO
VLDL: 27 mg/dL (ref 0–40)

## 2017-03-02 LAB — BASIC METABOLIC PANEL
ANION GAP: 8 (ref 5–15)
BUN: 11 mg/dL (ref 6–20)
CO2: 21 mmol/L — ABNORMAL LOW (ref 22–32)
Calcium: 8.1 mg/dL — ABNORMAL LOW (ref 8.9–10.3)
Chloride: 110 mmol/L (ref 101–111)
Creatinine, Ser: 0.79 mg/dL (ref 0.44–1.00)
Glucose, Bld: 131 mg/dL — ABNORMAL HIGH (ref 65–99)
POTASSIUM: 3.7 mmol/L (ref 3.5–5.1)
SODIUM: 139 mmol/L (ref 135–145)

## 2017-03-02 LAB — TRIGLYCERIDES: TRIGLYCERIDES: 100 mg/dL (ref <150)

## 2017-03-02 LAB — PROCALCITONIN: Procalcitonin: <0.1 ng/mL

## 2017-03-02 LAB — ECHOCARDIOGRAM COMPLETE
Height: 62 in
Weight: 2515.01 oz

## 2017-03-02 LAB — HEMOGLOBIN A1C
HEMOGLOBIN A1C: 5.4 % (ref 4.8–5.6)
MEAN PLASMA GLUCOSE: 108.28 mg/dL

## 2017-03-02 LAB — MRSA PCR SCREENING: MRSA by PCR: NEGATIVE

## 2017-03-02 LAB — POCT I-STAT 3, ART BLOOD GAS (G3+)
Acid-base deficit: 2 mmol/L (ref 0.0–2.0)
Bicarbonate: 22.1 mmol/L (ref 20.0–28.0)
O2 Saturation: 94 %
TCO2: 23 mmol/L (ref 22–32)
pCO2 arterial: 36.5 mmHg (ref 32.0–48.0)
pH, Arterial: 7.39 (ref 7.350–7.450)
pO2, Arterial: 72 mmHg — ABNORMAL LOW (ref 83.0–108.0)

## 2017-03-02 LAB — PHOSPHORUS
PHOSPHORUS: 2.9 mg/dL (ref 2.5–4.6)
Phosphorus: 3 mg/dL (ref 2.5–4.6)

## 2017-03-02 LAB — HIV ANTIBODY (ROUTINE TESTING W REFLEX): HIV Screen 4th Generation wRfx: NONREACTIVE

## 2017-03-02 LAB — SODIUM: Sodium: 141 mmol/L (ref 135–145)

## 2017-03-02 MED ORDER — PRO-STAT SUGAR FREE PO LIQD
30.0000 mL | Freq: Two times a day (BID) | ORAL | Status: DC
  Filled 2017-03-02: qty 30

## 2017-03-02 MED ORDER — PROPOFOL 1000 MG/100ML IV EMUL
0.0000 ug/kg/min | INTRAVENOUS | Status: DC
  Administered 2017-03-02: 25 ug/kg/min via INTRAVENOUS
  Administered 2017-03-02: 30 ug/kg/min via INTRAVENOUS
  Administered 2017-03-03: 20 ug/kg/min via INTRAVENOUS
  Filled 2017-03-02 (×4): qty 100

## 2017-03-02 MED ORDER — ACETAMINOPHEN 650 MG RE SUPP: 650.0000 mg | RECTAL | Status: DC | PRN

## 2017-03-02 MED ORDER — NICARDIPINE HCL IN NACL 20-0.86 MG/200ML-% IV SOLN
0.0000 mg/h | INTRAVENOUS | Status: DC
  Administered 2017-03-02: 5 mg via INTRAVENOUS
  Administered 2017-03-02: 3 mg/h via INTRAVENOUS
  Filled 2017-03-02: qty 200

## 2017-03-02 MED ORDER — HEPARIN SODIUM (PORCINE) 5000 UNIT/ML IJ SOLN
5000.0000 [IU] | Freq: Three times a day (TID) | INTRAMUSCULAR | Status: DC
  Administered 2017-03-02 – 2017-03-03 (×3): 5000 [IU] via SUBCUTANEOUS
  Filled 2017-03-02 (×3): qty 1

## 2017-03-02 MED ORDER — FENTANYL CITRATE (PF) 100 MCG/2ML IJ SOLN: 100.0000 ug | INTRAMUSCULAR | Status: DC | PRN

## 2017-03-02 MED ORDER — CHLORHEXIDINE GLUCONATE 0.12% ORAL RINSE (MEDLINE KIT)
15.0000 mL | Freq: Two times a day (BID) | OROMUCOSAL | Status: DC
  Administered 2017-03-02 – 2017-03-03 (×4): 15 mL via OROMUCOSAL

## 2017-03-02 MED ORDER — PHENYLEPHRINE HCL 10 MG/ML IJ SOLN
0.0000 ug/min | INTRAMUSCULAR | Status: DC
  Filled 2017-03-02: qty 1

## 2017-03-02 MED ORDER — TICAGRELOR 90 MG PO TABS
90.0000 mg | ORAL_TABLET | Freq: Two times a day (BID) | ORAL | Status: DC
  Administered 2017-03-02 (×2): 90 mg via ORAL
  Filled 2017-03-02 (×2): qty 1

## 2017-03-02 MED ORDER — DOCUSATE SODIUM 50 MG/5ML PO LIQD: 100.0000 mg | Freq: Two times a day (BID) | ORAL | Status: DC | PRN

## 2017-03-02 MED ORDER — ATORVASTATIN CALCIUM 80 MG PO TABS: 80.0000 mg | ORAL_TABLET | Freq: Every day | ORAL | Status: DC

## 2017-03-02 MED ORDER — VITAL HIGH PROTEIN PO LIQD
1000.0000 mL | ORAL | Status: DC
  Administered 2017-03-02 (×2): 1000 mL
  Filled 2017-03-02 (×2): qty 1000

## 2017-03-02 MED ORDER — ORAL CARE MOUTH RINSE
15.0000 mL | OROMUCOSAL | Status: DC
  Administered 2017-03-02 – 2017-03-03 (×13): 15 mL via OROMUCOSAL

## 2017-03-02 MED ORDER — SODIUM CHLORIDE 3 % IV SOLN
INTRAVENOUS | Status: DC
  Administered 2017-03-02 – 2017-03-03 (×2): 75 mL/h via INTRAVENOUS
  Filled 2017-03-02 (×4): qty 500

## 2017-03-02 MED ORDER — SODIUM CHLORIDE 0.9 % IV SOLN
INTRAVENOUS | Status: DC
  Administered 2017-03-02: 01:00:00 via INTRAVENOUS

## 2017-03-02 MED ORDER — VITAL HIGH PROTEIN PO LIQD: 1000.0000 mL | ORAL | Status: DC

## 2017-03-02 MED ORDER — ASPIRIN EC 81 MG PO TBEC: 81.0000 mg | DELAYED_RELEASE_TABLET | Freq: Every day | ORAL | Status: DC

## 2017-03-02 MED ORDER — ACETAMINOPHEN 325 MG PO TABS: 650.0000 mg | ORAL_TABLET | ORAL | Status: DC | PRN

## 2017-03-02 MED ORDER — BISACODYL 10 MG RE SUPP: 10.0000 mg | Freq: Every day | RECTAL | Status: DC | PRN

## 2017-03-02 MED ORDER — ONDANSETRON HCL 4 MG/2ML IJ SOLN: 4.0000 mg | Freq: Four times a day (QID) | INTRAMUSCULAR | Status: DC | PRN

## 2017-03-02 MED ORDER — NICARDIPINE HCL IN NACL 20-0.86 MG/200ML-% IV SOLN
INTRAVENOUS | Status: AC
  Administered 2017-03-02: 5 mg via INTRAVENOUS
  Filled 2017-03-02: qty 200

## 2017-03-02 MED ORDER — PANTOPRAZOLE SODIUM 40 MG IV SOLR
40.0000 mg | Freq: Every day | INTRAVENOUS | Status: DC
  Administered 2017-03-02: 40 mg via INTRAVENOUS
  Filled 2017-03-02: qty 40

## 2017-03-02 MED ORDER — ACETAMINOPHEN 160 MG/5ML PO SOLN
650.0000 mg | ORAL | Status: DC | PRN
  Administered 2017-03-02: 650 mg
  Filled 2017-03-02: qty 20.3

## 2017-03-02 MED ORDER — ASPIRIN 325 MG PO TABS
325.0000 mg | ORAL_TABLET | Freq: Every day | ORAL | Status: DC
  Administered 2017-03-02: 325 mg via ORAL
  Filled 2017-03-02: qty 1

## 2017-03-02 NOTE — Plan of Care (Signed)
TF initiated. Pt tolerating well.

## 2017-03-02 NOTE — Progress Notes (Signed)
Critical troponin from lab is Results for Katha HammingKNIGHT, Lianah V (MRN [id number]) as of 03/02/2017 06:33  Ref. Range 03/02/2017 05:01  Troponin I Latest Ref Range: <0.03 ng/mL 0.04 (HH)  [place]

## 2017-03-02 NOTE — Anesthesia Postprocedure Evaluation (Signed)
Anesthesia Post Note  Patient: Haley HammingKaren V Montgomery  Procedure(s) Performed: RADIOLOGY WITH ANESTHESIA (N/A )     Patient location during evaluation: SICU Anesthesia Type: General Level of consciousness: sedated Pain management: pain level controlled Vital Signs Assessment: post-procedure vital signs reviewed and stable Respiratory status: patient remains intubated per anesthesia plan Cardiovascular status: stable Postop Assessment: no apparent nausea or vomiting Anesthetic complications: no Comments: Patient remained intubated per plan, no antiemetics given.    Last Vitals:  Vitals:   03/02/17 0345 03/02/17 0400  BP:    Pulse:    Resp: (!) 23   Temp:  36.7 C  SpO2:      Last Pain:  Vitals:   03/02/17 0400  TempSrc: Axillary                 Cecile HearingStephen Edward Turk

## 2017-03-02 NOTE — Progress Notes (Addendum)
Called POA Dwayne and updated him on results on MRI Brain.  Patient has massive Left hemispheric stroke with entirety of MCA involved. She has 5 mm midline shift,  no herniation seen on scan. Discussed with POA impending herniation in the next few days and will likely need Hemicrani for survival. Most likely outcome if she survives is she will be bed bound, aphasic and paralayzed on right side. POA states he will think about it and  will call in the morning to discuss this further. He has made her DNR.  I will start her on 3 pc NS which is likely a temporizing measure.     Georgiana SpinnerSushanth Sadao Weyer MD [facility] [phone]

## 2017-03-02 NOTE — Progress Notes (Signed)
Preliminary results by tech - Carotid Duplex Completed. Right ICA patent with no significant stenosis. Left ICA stent appears occluded. Right vert is patent with antegrade flow. Left vert is patent with retrograde flow. Marilynne Halstedita Yamilee Harmes, BS, RDMS, RVT

## 2017-03-02 NOTE — Progress Notes (Signed)
  Echocardiogram 2D Echocardiogram has been performed.  [name], Haley Montgomery R 03/02/2017, 2:57 PM

## 2017-03-02 NOTE — Transfer of Care (Signed)
Immediate Anesthesia Transfer of Care Note  Patient: Haley HammingKaren V Montgomery  Procedure(s) Performed: RADIOLOGY WITH ANESTHESIA (N/A )  Patient Location: ICU  Anesthesia Type:General  Level of Consciousness: unresponsive and Patient remains intubated per anesthesia plan  Airway & Oxygen Therapy: Patient remains intubated per anesthesia plan and Patient placed on Ventilator (see vital sign flow sheet for setting)  Post-op Assessment: Report given to RN  Post vital signs: Reviewed and stable  Last Vitals:  Vitals:   02/27/2017 1910 03/02/17 0001  BP:    Pulse: 72 77  Resp: 19 16  Temp:    SpO2: 100% 100%    Last Pain:  Vitals:   02/24/2017 1504  TempSrc: Rectal         Complications: No apparent anesthesia complications

## 2017-03-02 NOTE — Progress Notes (Signed)
STROKE TEAM PROGRESS NOTE   SUBJECTIVE (INTERVAL HISTORY) Her RN is at the bedside.  I talked with her POA Dewyne over the phone, updated pt current condition, treatment plan. She still intubated on sedation, BP stable off cardene. Still has right hemiplegia. Carotid doppler showed left ICA stent appears to be occluded. Pending MRI and MRA and TTE.    OBJECTIVE Temp:  [98.1 F (36.7 C)-99.6 F (37.6 C)] 99.4 F (37.4 C) (11/23 1200) Pulse Rate:  [25-113] 51 (11/23 1315) Cardiac Rhythm: Normal sinus rhythm (11/23 1200) Resp:  [14-27] 18 (11/23 1315) BP: (101-176)/(50-89) 143/56 (11/23 1315) SpO2:  [65 %-100 %] 100 % (11/23 1315) Arterial Line BP: (62-148)/(31-110) 86/53 (11/23 1200) FiO2 (%):  [30 %-40 %] 30 % (11/23 1158) Weight:  [157 lb 3 oz (71.3 kg)] 157 lb 3 oz (71.3 kg) (11/23 0001)  CBC:  Recent Labs  Lab 03/09/2017 1503  02/24/2017 2242 03/02/17 0501  WBC 12.0*  --   --  9.0  NEUTROABS 11.0*  --   --  7.9*  HGB 16.9*   < > 12.2 13.1  HCT 48.7*   < > 36.0 38.7  MCV 93.8  --   --  93.9  PLT 189  --   --  166   < > = values in this interval not displayed.    Basic Metabolic Panel:  Recent Labs  Lab 02/14/2017 1503 02/13/2017 1512 02/22/2017 2242 03/02/17 0501 03/02/17 1204  NA 140 142 143 139  --   K 4.4 4.4 3.7 3.7  --   CL 106 106  --  110  --   CO2 24  --   --  21*  --   GLUCOSE 120* 119*  --  131*  --   BUN 16 18  --  11  --   CREATININE 0.91 0.80  --  0.79  --   CALCIUM 9.4  --   --  8.1*  --   MG  --   --   --   --  1.8  PHOS  --   --   --   --  3.0    Lipid Panel:     Component Value Date/Time   CHOL 194 03/02/2017 0501   TRIG 133 03/02/2017 0501   HDL 36 (L) 03/02/2017 0501   CHOLHDL 5.4 03/02/2017 0501   VLDL 27 03/02/2017 0501   LDLCALC 131 (H) 03/02/2017 0501   HgbA1c:  Lab Results  Component Value Date   HGBA1C 5.4 03/02/2017   Urine Drug Screen:     Component Value Date/Time   LABOPIA NONE DETECTED 02/25/2017 1843   COCAINSCRNUR NONE  DETECTED 02/08/2017 1843   LABBENZ NONE DETECTED 02/25/2017 1843   AMPHETMU NONE DETECTED 03/05/2017 1843   THCU NONE DETECTED 03/02/2017 1843   LABBARB NONE DETECTED 02/17/2017 1843    Alcohol Level     Component Value Date/Time   ETH <10 02/14/2017 1503    IMAGING I have personally reviewed the radiological images below and agree with the radiology interpretations.  Ct Angio Head W Or Wo Contrast Ct Angio Neck W And/or Wo Contrast Ct Cerebral Perfusion W Contrast 03/02/2017 IMPRESSION:  1. 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 The left ICA is nearly isolated from the circle-of-Willis given a tiny anterior communicating artery and fetal type left PCA that has superimposed tandem high-grade stenoses. There is underfilling of vessels to the left cerebral hemisphere with ischemia/delayed flow by perfusion. Cerebral  perfusion estimates a 10 cc left parietal white matter infarct that is underestimated - there is left parietal cortex and left lateral lenticulostriate infarcts by noncontrast CT.  2. Left proximal subclavian occlusion with less intense/delayed downstream opacification and implied retrograde flow in the left vertebral artery. The left vertebral artery is essentially occluded at the V4 segment and this flow is retrograde via an occipital collateral.  3. High-grade proximal right V1 segment stenosis.  4. Mild fat stranding over the right clavicle, please correlate for clinical findings of contusion or cellulitis.   Cerebral Angiogram 02/16/2017 1.Occluded LT ICA  prox and at the terminus with occlusion of the Lt MCA prox. Revascularization  Of Lt ICA prox  With stent assisted angioplasty and rescue pharmacological thrombolysis of acute platelet aggregation within the stents  With 10 mg of IA REOPRO and  8.5mg  of INTEGRELIN.. 2.S/P x passes with solitaire 4mm x 40 mm retrieval device of the occluded Lt MCA  with no significant recanalization.  Ct  Head Wo Contrast 03/02/2017 IMPRESSION:  1. No intracranial hemorrhage or contrast extravasation/staining.  2. Unchanged appearance of focal areas of hypoattenuation in the left occipital lobe and left lenticulostriate, which may indicate a recent infarcts.   Ct Head Wo Contrast 03/04/2017 IMPRESSION:  1. Recent appearing small infarcts in the left parietal cortex (posterior MCA watershed) and left basal ganglia (lateral lenticulostriate). CTA and cerebral perfusion or pending.  2. Small remote left cerebellar infarcts. Presumed chronic small vessel ischemic in the cerebral white matter.   TTE - pending  Bilateral Carotid Dopplers  03/02/2017 Right ICA patent with no significant stenosis. Left ICA stent appears occluded. Right vert is patent with antegrade flow. Left vert is patent with retrograde flow.    PHYSICAL EXAM  Temp:  [98.1 F (36.7 C)-99.6 F (37.6 C)] 99.4 F (37.4 C) (11/23 1200) Pulse Rate:  [25-113] 51 (11/23 1315) Resp:  [14-27] 18 (11/23 1315) BP: (101-176)/(50-89) 143/56 (11/23 1315) SpO2:  [65 %-100 %] 100 % (11/23 1315) Arterial Line BP: (62-148)/(31-110) 86/53 (11/23 1200) FiO2 (%):  [30 %-40 %] 30 % (11/23 1158) Weight:  [157 lb 3 oz (71.3 kg)] 157 lb 3 oz (71.3 kg) (11/23 0001)  General - Well nourished, well developed, intubated on sedation.  Ophthalmologic - Fundi not visualized due to noncooperation.  Cardiovascular - Regular rate and rhythm.  Neuro -intubated on sedation, eyes closed, briefly open eyes on repetitive stimulation.  Not following commands, not blinking to visual threat with eyelid held open, no tracking.  Eyes medial position, sluggish doll's eyes, positive corneal and gag.  Facial symmetry not able to tested due to ET tube.  Left side UE and LE spontaneous movement and against gravity, RUE flaccid and RLE slight withdrawal on pain summation, however, strength not able to adequately tested due to right femoral sheath in place. Sensation,  coordination and gait not tested.   ASSESSMENT/PLAN Ms. Katha HammingKaren V Hursey is a 61 y.o. female with history of hypothyroidism, hyperlipidemia, hypertension, and remote infarcts by imaging presenting with right hemi-plegia, slurred speech, and left gaze preference. She did not receive IV t-PA due to late presentation. Had IR with left ICA stent but subsequent in-stent stenosis and failed left MCA recannulization.   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  Carotid Doppler - Lt ICA stent occluded. Lt vert is patent with retrograde flow.   2D Echo - pending  LDL - 131  HgbA1c - 5.4  VTE prophylaxis - heparin subq Diet NPO time specified  No antithrombotic prior to admission, now on ASA 81mg  and brilinta 90mg  bid s/p ICA stent.  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  CTA showed left subclavian A occlusion with VA retrograde filling  Confirmed on CUS with left VA retrograde flow  Concerning for cardioembolic source   Respiratory failure  Intubated on vent  CCM on board  Extubate as able   Hypertension  Stable  BP goal 120-160  Off cardene now  Long-term BP goal normotensive  Hyperlipidemia  Home meds: No lipid lowering medications prior to admission  LDL 131, goal < 70  Add Lipitor 80 mg daily   Continue statin at discharge  Other  Stroke Risk Factors  Advanced age  Former cigarette smoker - quit 14 years ago  ETOH use, advised to drink no more than 1 drink per day.  Hx stroke/TIA (by imaging)  Other Active Problems  hypothyroidism   Hospital day # 1  This patient is critically ill due to left MCA infarct with left ICA occlusion, left MCA and subclavian occlusion and at significant risk of neurological worsening, death form recurrent stroke, hemorrhagic conversion, cerebral edema, uncal herniation, seizure, heart failure. This patient's care requires constant monitoring of vital signs, hemodynamics, respiratory and cardiac monitoring, review of multiple databases, neurological assessment, discussion with family, other specialists and medical decision making of high complexity. I had long discussion with POA at bedside, updated pt current condition, treatment plan and potential prognosis. They expressed understanding and appreciation. I also discussed with Dr. Tyson Alias. I spent 45 minutes of neurocritical care time in the care of this patient.  Marvel Plan, MD PhD Stroke Neurology 03/02/2017 3:00 PM   To contact Stroke Continuity provider, please refer to [email or web address]. After hours, contact General Neurology

## 2017-03-02 NOTE — Progress Notes (Signed)
PT Cancellation Note  Patient Details Name: Haley HammingKaren V Montgomery MRN: [id number] DOB: March 05, 1956   Cancelled Treatment:    Reason Eval/Treat Not Completed: Patient not medically ready. Pt remains on bedrest due to sheath placement. Please advance activity orders as appropriate to allow for ordered PT eval.   Siraj Dermody M Rosetta Rupnow 03/02/2017, 6:42 AM   Lewis ShockAshly Lareina Espino, PT, DPT Pager #: [phone] Office #: [phone]

## 2017-03-02 NOTE — Progress Notes (Signed)
Patient identified by name of d.o.b. 7Fr Exoseal was deployed/9Fr Sheath was pulled at 1500 hrs. Manual pressure was held until hemostasis was achieved @ 1525 hrs. No immediate complications. Morrie Sheldonshley R.N was at bedside to verify hemostasis at right groin. DP/TP pulses were +2s on right side.

## 2017-03-02 NOTE — Progress Notes (Signed)
SLP Cancellation Note  Patient Details Name: Haley Montgomery MRN: [id number] DOB: 17-Oct-1955   Cancelled treatment:       Reason Eval/Treat Not Completed: Medical issues which prohibited therapy(Patient intubated. Will f/u 11/24. ) Ferdinand LangoLeah Verlin Duke MA, CCC-SLP [phone]   Haley Montgomery 03/02/2017, 9:45 AM

## 2017-03-02 NOTE — Progress Notes (Signed)
OT Cancellation Note  Patient Details Name: Haley HammingKaren V Montgomery MRN: [id number] DOB: 1955/05/03   Cancelled Treatment:    Reason Eval/Treat Not Completed: Patient not medically ready(sedated/intubated)  Burnett CorrenteWARD,HILLARY  Amee Boothe, OT/L  [phone] 03/02/2017 03/02/2017, 5:10 PM

## 2017-03-02 NOTE — Consult Note (Signed)
PULMONARY / CRITICAL CARE MEDICINE   Name: Haley HammingKaren V Montgomery MRN: [id number] DOB: 05-21-1955    ADMISSION DATE:  02/22/2017 CONSULTATION DATE:  03/02/2017  REFERRING MD:  Dr. Corliss Skainseveshwar   CHIEF COMPLAINT:  CVA  HISTORY OF PRESENT ILLNESS:  HPI obtained from medical chart review as patient is intubated and sedated; no family present.   61 year old female with PMH of HTN, HLD, and hypothyroidism who presented on 11/22 with right hemiplegia, aphasia and left gaze.  Patient lives with her 571 year old granddaughter.  LSW on Tuesday around 0900.  Later that day, she noticed patient having some slurred speech, left gaze, and altered gait.  Reports that she slept most of Wednesday and today found her ground altered.    In the ED, CT noted for small infarcts in the left parietal cortex and left basal ganglia with small remote left cerebellar infarcts.  CTA head and neck showing complete occlusion of left ICA which was thought to be the cause   CT perfusion study showed hypoperfusion of left cerebral hemisphere with a small infarct core.  Due to onset of symptoms, patient was out of the window for tPA.  Taken to neuro IR for revascularization attempt of left ICA/ Left MCA.  PCCM consulted for ventilator management.    PAST MEDICAL HISTORY :  She  has a past medical history of ESSENTIAL HYPERTENSION (08/11/2009), HYPERLIPIDEMIA (09/14/2008), HYPOTHYROIDISM (09/14/2008), and MRI, BRAIN, ABNORMAL (08/16/2009).  PAST SURGICAL HISTORY: She  has a past surgical history that includes Thyroidectomy (2002).  Allergies  Allergen Reactions  . Doxycycline Anaphylaxis    Sore throat, cough  . Olmesartan Medoxomil Other (See Comments)    Fainting spells (Benicar)    No current facility-administered medications on file prior to encounter.    Current Outpatient Medications on File Prior to Encounter  Medication Sig  . ketorolac (ACULAR) 0.4 % SOLN   . ofloxacin (OCUFLOX) 0.3 % ophthalmic solution   . prednisoLONE  acetate (PRED FORTE) 1 % ophthalmic suspension   . SYNTHROID 112 MCG tablet TAKE 1 TABLET(112 MCG) BY MOUTH DAILY BEFORE BREAKFAST  . levothyroxine (SYNTHROID, LEVOTHROID) 100 MCG tablet Take daily (Patient not taking: Reported on 02/12/2017)    FAMILY HISTORY:  Her indicated that her mother is alive. She indicated that her father is deceased. She indicated that the status of her brother is unknown. She indicated that her maternal grandmother is deceased. She indicated that her maternal grandfather is deceased. She indicated that her paternal grandmother is deceased. She indicated that her paternal grandfather is deceased. She indicated that her daughter is deceased.   SOCIAL HISTORY: She  reports that she quit smoking about 14 years ago. Her smoking use included cigarettes. She has a 15.00 pack-year smoking history. she has never used smokeless tobacco. She reports that she drinks alcohol. She reports that she does not use drugs.  REVIEW OF SYSTEMS:   Unable to assess as patient is intubated and sedated on MV.  SUBJECTIVE:  Propofol at 20 mcg/kg/min  VITAL SIGNS: BP 139/67   Pulse (!) 35   Temp 98.6 F (37 C) (Rectal)   Resp 20   Ht 5\' 2"  (1.575 m)   Wt 157 lb 3 oz (71.3 kg)   SpO2 (!) 65%   BMI 28.75 kg/m   HEMODYNAMICS:    VENTILATOR SETTINGS: Vent Mode: PRVC FiO2 (%):  [40 %] 40 % Set Rate:  [16 bmp] 16 bmp Vt Set:  [450 mL] 450 mL PEEP:  [  5 cmH20] 5 cmH20 Plateau Pressure:  [18 cmH20] 18 cmH20  INTAKE / OUTPUT: I/O last 3 completed shifts: In: 1000 [IV Piggyback:1000] Out: -   PHYSICAL EXAMINATION: General:  Critically ill female on MV  HEENT: MM pink/moist, protruding tongue, 7.5 ETT 22 cm at lips, OGT, patient has bottom dentures that are securely attached, no JVD, pupils 3/reactive Neuro:  Sedated on propofol, unresponsive, no response to noxious stimuli, + cough CV:  IR RR , no m/r/g, right groin site level 0, 2+ pulses PULM: even/non-labored on MV, lungs  bilaterally coarse GI: soft, bs active  Extremities: warm/dry, no edema  Skin: no rashes, ? Bruising under right clavicle   LABS:  BMET Recent Labs  Lab 02/21/2017 1503 02/08/2017 1512  NA 140 142  K 4.4 4.4  CL 106 106  CO2 24  --   BUN 16 18  CREATININE 0.91 0.80  GLUCOSE 120* 119*    Electrolytes Recent Labs  Lab 02/16/2017 1503  CALCIUM 9.4    CBC Recent Labs  Lab 02/16/2017 1503 02/18/2017 1512  WBC 12.0*  --   HGB 16.9* 16.7*  HCT 48.7* 49.0*  PLT 189  --     Coag's Recent Labs  Lab 03/08/2017 1503  APTT 27  INR 1.01    Sepsis Markers No results for input(s): LATICACIDVEN, PROCALCITON, O2SATVEN in the last 168 hours.  ABG Recent Labs  Lab 03/02/17 0108  PHART 7.390  PCO2ART 36.5  PO2ART 72.0*    Liver Enzymes Recent Labs  Lab 02/10/2017 1503  AST 27  ALT 14  ALKPHOS 94  BILITOT 1.0  ALBUMIN 4.0    Cardiac Enzymes No results for input(s): TROPONINI, PROBNP in the last 168 hours.  Glucose No results for input(s): GLUCAP in the last 168 hours.  Imaging Ct Angio Head W Or Wo Contrast  Result Date: 02/28/2017 CLINICAL DATA:  Arterial stricture or occlusion. Slurred speech left base and altered gait. EXAM: CT ANGIOGRAPHY HEAD AND NECK CT PERFUSION BRAIN TECHNIQUE: Multidetector CT imaging of the head and neck was performed using the standard protocol during bolus administration of intravenous contrast. Multiplanar CT image reconstructions and MIPs were obtained to evaluate the vascular anatomy. Carotid stenosis measurements (when applicable) are obtained utilizing NASCET criteria, using the distal internal carotid diameter as the denominator. Multiphase CT imaging of the brain was performed following IV bolus contrast injection. Subsequent parametric perfusion maps were calculated using RAPID software. CONTRAST:  ISOVUE-370 IOPAMIDOL (ISOVUE-370) INJECTION 76% COMPARISON:  Brain MRI 08/13/2009 FINDINGS: CTA NECK FINDINGS Aortic arch:  Atheromatous plaque. No acute finding or visible dilatation. Right carotid system: Atheromatous wall thickening of the brachiocephalic. Calcified and noncalcified plaque at the common carotid bifurcation and ICA bulb without flow limiting stenosis or ulceration. Left carotid system: Atheromatous plaque at the common carotid origin and distal segment. There is calcified plaque at the ICA bulb with occlusion continuing throughout the neck. Good ECA patency. A left occipital collateral servers the left vertebral artery that is flowing retrograde. Vertebral arteries: Proximal left subclavian occlusion. There is less intense flow into the left subclavian and lower vertebral artery, steal phenomenon. Atherosclerosis of the brachiocephalic and right subclavian artery without flow limiting stenosis. There is a focal high-grade narrowing of the right V1 segment. Skeleton: No acute or aggressive finding. Other neck: Thyroidectomy.  No acute finding. Upper chest: Centrilobular micro nodularity, suspect this is smoking-related. There is stranding over the right upper chest wall fat, nonspecific. Review of the MIP images confirms the  above findings CTA HEAD FINDINGS Anterior circulation: The left ICA reconstitutes at the proximal cavernous segment. There is less intense opacification of the left ICA and downstream vessels. At tiny anterior communicating artery is present on coronal reformats. The left PCA is fetal type, as below. Proximal left M2 branch stenosis. No MCA or ACA proximal branch occlusion is seen. Atherosclerosis of the right carotid siphon. No right-sided branch occlusion is seen. Posterior circulation: Right dominant vertebral artery. Minimal flow seen in the left V4 segment, essentially occluded at the dura. The left vertebral artery reconstitutes from the occipital artery of the ECA. The basilar is small in the setting of bilateral fetal type PCA. There is superimposed advanced tandem narrowing of the left P2  segment with poor downstream flow. Venous sinuses: As permitted by contrast timing, patent. Anatomic variants: As above Delayed phase: No abnormal intracranial enhancement. Review of the MIP images confirms the above findings CT Brain Perfusion Findings: CBF (<30%) Volume: 10mL. This is underestimated as parietal cortex and left basal ganglia infarcts are not included. Perfusion (Tmax>6.0s) volume: . Left ACA, MCA, and PCA distributions are diffusely involved given the essentially isolated left ICA and fetal type left PCA. Mismatch Volume: Overestimated as above. Infarction Location:Left parietal white matter by perfusion. Left parietal cortex and basal ganglia by noncontrast CT. These results were called by telephone at the time of interpretation on 03-18-17 at 4:31 pm to Dr. Melene Plan , who verbally acknowledged these results. IMPRESSION: 1. 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 The left ICA is nearly isolated from the circle-of-Willis given a tiny anterior communicating artery and fetal type left PCA that has superimposed tandem high-grade stenoses. There is underfilling of vessels to the left cerebral hemisphere with ischemia/delayed flow by perfusion. Cerebral perfusion estimates a 10 cc left parietal white matter infarct that is underestimated - there is left parietal cortex and left lateral lenticulostriate infarcts by noncontrast CT. 2. Left proximal subclavian occlusion with less intense/delayed downstream opacification and implied retrograde flow in the left vertebral artery. The left vertebral artery is essentially occluded at the V4 segment and this flow is retrograde via an occipital collateral. 3. High-grade proximal right V1 segment stenosis. 4. Mild fat stranding over the right clavicle, please correlate for clinical findings of contusion or cellulitis. Electronically Signed   By: Marnee Spring M.D.   On: 18-Mar-2017 16:50   Ct Head Wo Contrast  Result  Date: 03/02/2017 CLINICAL DATA:  Status post stroke intervention EXAM: CT HEAD WITHOUT CONTRAST TECHNIQUE: Contiguous axial images were obtained from the base of the skull through the vertex without intravenous contrast. COMPARISON:  Head CT Mar 18, 2017 at 4:03 p.m. FINDINGS: Brain: Focal hypodensity of the left basal ganglia and left occipital lobe are unchanged. There are old left cerebellar infarcts. There is no intracranial hemorrhage or contrast staining. No midline shift or other mass effect. No hydrocephalus. Vascular: There is intravascular contrast enhancement from retained contrast from earlier studies. Skull: Normal calvarium and skull base. Sinuses/Orbits: Moderate maxillary, sphenoid and ethmoid sinus opacification. Normal orbits. Other: None IMPRESSION: 1. No intracranial hemorrhage or contrast extravasation/staining. 2. Unchanged appearance of focal areas of hypoattenuation in the left occipital lobe and left lenticulostriate, which may indicate a recent infarcts. Electronically Signed   By: Deatra Robinson M.D.   On: 03/02/2017 00:08   Ct Head Wo Contrast  Result Date: Mar 18, 2017 CLINICAL DATA:  Slurred speech and left-sided gaze. Altered gait. Last seen normal on tuesday. EXAM: CT HEAD WITHOUT CONTRAST  TECHNIQUE: Contiguous axial images were obtained from the base of the skull through the vertex without intravenous contrast. COMPARISON:  08/13/2009 FINDINGS: Brain: Cytotoxic type edema without volume loss seen in the left occipital parietal cortex, posterior MCA border zone territory. There is also a recent appearing infarct in the left basal ganglia and corona radiata. Patchy chronic microvascular ischemic type change in the cerebral white matter. Small remote left cerebellar infarcts. No hemorrhage, hydrocephalus, or masslike finding. Vascular: No hyperdense vessel.  Atherosclerotic calcification. Skull: Normal. Negative for fracture or focal lesion. Sinuses/Orbits: Negative IMPRESSION: 1.  Recent appearing small infarcts in the left parietal cortex (posterior MCA watershed) and left basal ganglia (lateral lenticulostriate). CTA and cerebral perfusion or pending. 2. Small remote left cerebellar infarcts. Presumed chronic small vessel ischemic in the cerebral white matter. Electronically Signed   By: Marnee Spring M.D.   On: 03/06/2017 16:12   Ct Angio Neck W And/or Wo Contrast  Result Date: 03/07/2017 CLINICAL DATA:  Arterial stricture or occlusion. Slurred speech left base and altered gait. EXAM: CT ANGIOGRAPHY HEAD AND NECK CT PERFUSION BRAIN TECHNIQUE: Multidetector CT imaging of the head and neck was performed using the standard protocol during bolus administration of intravenous contrast. Multiplanar CT image reconstructions and MIPs were obtained to evaluate the vascular anatomy. Carotid stenosis measurements (when applicable) are obtained utilizing NASCET criteria, using the distal internal carotid diameter as the denominator. Multiphase CT imaging of the brain was performed following IV bolus contrast injection. Subsequent parametric perfusion maps were calculated using RAPID software. CONTRAST:  ISOVUE-370 IOPAMIDOL (ISOVUE-370) INJECTION 76% COMPARISON:  Brain MRI 08/13/2009 FINDINGS: CTA NECK FINDINGS Aortic arch: Atheromatous plaque. No acute finding or visible dilatation. Right carotid system: Atheromatous wall thickening of the brachiocephalic. Calcified and noncalcified plaque at the common carotid bifurcation and ICA bulb without flow limiting stenosis or ulceration. Left carotid system: Atheromatous plaque at the common carotid origin and distal segment. There is calcified plaque at the ICA bulb with occlusion continuing throughout the neck. Good ECA patency. A left occipital collateral servers the left vertebral artery that is flowing retrograde. Vertebral arteries: Proximal left subclavian occlusion. There is less intense flow into the left subclavian and lower vertebral  artery, steal phenomenon. Atherosclerosis of the brachiocephalic and right subclavian artery without flow limiting stenosis. There is a focal high-grade narrowing of the right V1 segment. Skeleton: No acute or aggressive finding. Other neck: Thyroidectomy.  No acute finding. Upper chest: Centrilobular micro nodularity, suspect this is smoking-related. There is stranding over the right upper chest wall fat, nonspecific. Review of the MIP images confirms the above findings CTA HEAD FINDINGS Anterior circulation: The left ICA reconstitutes at the proximal cavernous segment. There is less intense opacification of the left ICA and downstream vessels. At tiny anterior communicating artery is present on coronal reformats. The left PCA is fetal type, as below. Proximal left M2 branch stenosis. No MCA or ACA proximal branch occlusion is seen. Atherosclerosis of the right carotid siphon. No right-sided branch occlusion is seen. Posterior circulation: Right dominant vertebral artery. Minimal flow seen in the left V4 segment, essentially occluded at the dura. The left vertebral artery reconstitutes from the occipital artery of the ECA. The basilar is small in the setting of bilateral fetal type PCA. There is superimposed advanced tandem narrowing of the left P2 segment with poor downstream flow. Venous sinuses: As permitted by contrast timing, patent. Anatomic variants: As above Delayed phase: No abnormal intracranial enhancement. Review of the MIP images confirms the  above findings CT Brain Perfusion Findings: CBF (<30%) Volume: 10mL. This is underestimated as parietal cortex and left basal ganglia infarcts are not included. Perfusion (Tmax>6.0s) volume: 323mL. Left ACA, MCA, and PCA distributions are diffusely involved given the essentially isolated left ICA and fetal type left PCA. Mismatch Volume: Overestimated as above. Infarction Location:Left parietal white matter by perfusion. Left parietal cortex and basal ganglia by  noncontrast CT. These results were called by telephone at the time of interpretation on 02/15/2017 at 4:31 pm to Dr. Melene PlanAN FLOYD , who verbally acknowledged these results. IMPRESSION: 1. 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 The left ICA is nearly isolated from the circle-of-Willis given a tiny anterior communicating artery and fetal type left PCA that has superimposed tandem high-grade stenoses. There is underfilling of vessels to the left cerebral hemisphere with ischemia/delayed flow by perfusion. Cerebral perfusion estimates a 10 cc left parietal white matter infarct that is underestimated - there is left parietal cortex and left lateral lenticulostriate infarcts by noncontrast CT. 2. Left proximal subclavian occlusion with less intense/delayed downstream opacification and implied retrograde flow in the left vertebral artery. The left vertebral artery is essentially occluded at the V4 segment and this flow is retrograde via an occipital collateral. 3. High-grade proximal right V1 segment stenosis. 4. Mild fat stranding over the right clavicle, please correlate for clinical findings of contusion or cellulitis. Electronically Signed   By: Marnee SpringJonathon  Watts M.D.   On: 03/09/2017 16:50   Ct Cerebral Perfusion W Contrast  Result Date: 02/24/2017 CLINICAL DATA:  Arterial stricture or occlusion. Slurred speech left base and altered gait. EXAM: CT ANGIOGRAPHY HEAD AND NECK CT PERFUSION BRAIN TECHNIQUE: Multidetector CT imaging of the head and neck was performed using the standard protocol during bolus administration of intravenous contrast. Multiplanar CT image reconstructions and MIPs were obtained to evaluate the vascular anatomy. Carotid stenosis measurements (when applicable) are obtained utilizing NASCET criteria, using the distal internal carotid diameter as the denominator. Multiphase CT imaging of the brain was performed following IV bolus contrast injection. Subsequent  parametric perfusion maps were calculated using RAPID software. CONTRAST:  100mL ISOVUE-370 IOPAMIDOL (ISOVUE-370) INJECTION 76% COMPARISON:  Brain MRI 08/13/2009 FINDINGS: CTA NECK FINDINGS Aortic arch: Atheromatous plaque. No acute finding or visible dilatation. Right carotid system: Atheromatous wall thickening of the brachiocephalic. Calcified and noncalcified plaque at the common carotid bifurcation and ICA bulb without flow limiting stenosis or ulceration. Left carotid system: Atheromatous plaque at the common carotid origin and distal segment. There is calcified plaque at the ICA bulb with occlusion continuing throughout the neck. Good ECA patency. A left occipital collateral servers the left vertebral artery that is flowing retrograde. Vertebral arteries: Proximal left subclavian occlusion. There is less intense flow into the left subclavian and lower vertebral artery, steal phenomenon. Atherosclerosis of the brachiocephalic and right subclavian artery without flow limiting stenosis. There is a focal high-grade narrowing of the right V1 segment. Skeleton: No acute or aggressive finding. Other neck: Thyroidectomy.  No acute finding. Upper chest: Centrilobular micro nodularity, suspect this is smoking-related. There is stranding over the right upper chest wall fat, nonspecific. Review of the MIP images confirms the above findings CTA HEAD FINDINGS Anterior circulation: The left ICA reconstitutes at the proximal cavernous segment. There is less intense opacification of the left ICA and downstream vessels. At tiny anterior communicating artery is present on coronal reformats. The left PCA is fetal type, as below. Proximal left M2 branch stenosis. No MCA or ACA  proximal branch occlusion is seen. Atherosclerosis of the right carotid siphon. No right-sided branch occlusion is seen. Posterior circulation: Right dominant vertebral artery. Minimal flow seen in the left V4 segment, essentially occluded at the dura. The  left vertebral artery reconstitutes from the occipital artery of the ECA. The basilar is small in the setting of bilateral fetal type PCA. There is superimposed advanced tandem narrowing of the left P2 segment with poor downstream flow. Venous sinuses: As permitted by contrast timing, patent. Anatomic variants: As above Delayed phase: No abnormal intracranial enhancement. Review of the MIP images confirms the above findings CT Brain Perfusion Findings: CBF (<30%) Volume: 10mL. This is underestimated as parietal cortex and left basal ganglia infarcts are not included. Perfusion (Tmax>6.0s) volume: . Left ACA, MCA, and PCA distributions are diffusely involved given the essentially isolated left ICA and fetal type left PCA. Mismatch Volume: Overestimated as above. Infarction Location:Left parietal white matter by perfusion. Left parietal cortex and basal ganglia by noncontrast CT. These results were called by telephone at the time of interpretation on 02/14/2017 at 4:31 pm to Dr. Melene Plan , who verbally acknowledged these results. IMPRESSION: 1. 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 The left ICA is nearly isolated from the circle-of-Willis given a tiny anterior communicating artery and fetal type left PCA that has superimposed tandem high-grade stenoses. There is underfilling of vessels to the left cerebral hemisphere with ischemia/delayed flow by perfusion. Cerebral perfusion estimates a 10 cc left parietal white matter infarct that is underestimated - there is left parietal cortex and left lateral lenticulostriate infarcts by noncontrast CT. 2. Left proximal subclavian occlusion with less intense/delayed downstream opacification and implied retrograde flow in the left vertebral artery. The left vertebral artery is essentially occluded at the V4 segment and this flow is retrograde via an occipital collateral. 3. High-grade proximal right V1 segment stenosis. 4. Mild fat stranding  over the right clavicle, please correlate for clinical findings of contusion or cellulitis. Electronically Signed   By: Marnee Spring M.D.   On: 03/05/2017 16:50   Portable Chest Xray  Result Date: 03/02/2017 CLINICAL DATA:  Endotracheal tube position EXAM: PORTABLE CHEST 1 VIEW COMPARISON:  None. FINDINGS: The endotracheal tube tip is at the level of the clavicular heads. Orogastric tube tip and side-port in the stomach. Normal cardiomediastinal contours. Mild peribronchial opacities without pulmonary edema or focal consolidation. IMPRESSION: Radiographically appropriate position of endotracheal tube at the level of the clavicular heads. Electronically Signed   By: Deatra Robinson M.D.   On: 03/02/2017 01:16   STUDIES:  CT Head 11/22 >> 1. Recent appearing small infarcts in the left parietal cortex (posterior MCA watershed) and left basal ganglia (lateral lenticulostriate). CTA and cerebral perfusion or pending. 2. Small remote left cerebellar infarcts. Presumed chronic small vessel ischemic in the cerebral white matter.  CTA head/ neck/ perfusion 11/22 >> 1. 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 The left ICA is nearly isolated from the circle-of-Willis given a tiny anterior communicating artery and fetal type left PCA that has superimposed tandem high-grade stenoses. There is underfilling of vessels to the left cerebral hemisphere with ischemia/delayed flow by perfusion. Cerebral perfusion estimates a 10 cc left parietal white matter infarct that is underestimated - there is left parietal cortex and left lateral lenticulostriate infarcts by noncontrast CT. 2. Left proximal subclavian occlusion with less intense/delayed downstream opacification and implied retrograde flow in the left vertebral artery. The left vertebral artery is essentially  occluded at the V4 segment and this flow is retrograde via an occipital collateral. 3. High-grade proximal right  V1 segment stenosis. 4. Mild fat stranding over the right clavicle, please correlate for clinical findings of contusion or cellulitis.  CT head 11/22 s/p procedure >>  1. No intracranial hemorrhage or contrast extravasation/staining. 2. Unchanged appearance of focal areas of hypoattenuation in the left occipital lobe and left lenticulostriate, which may indicate a recent infarcts  CXR 11/23 >>  CULTURES: MRSA PCR 11/23 >>  ANTIBIOTICS: none  SIGNIFICANT EVENTS: 11/22 Admit  LINES/TUBES: 11/22 ETT >> 11/22 OGT >> 11/22 Left Aline >> 11/22 R femoral sheath >> 11/22 Foley >>  DISCUSSION: 61 yoF HTN, HLD, and hypothyroidism who presented on 11/22 with right hemiplegia, aphasia and left gaze found to have L ICA occulusion and L MCA CVA.  LSW 11/20.  Taken to neuro IR for ICA stent but unable to revascularize L MCA.  ASSESSMENT / PLAN:  PULMONARY A: Acute respiratory insufficiency in the setting of CVA  P:   Full MV support CXR and ABG now Trend CXR VAP protocol SBT when meets criteria  SLP eval when appropriate   CARDIOVASCULAR A:  Hx HTN P:  Tele monitoring Neosynephrine / Cardene to maintain SBP goal 120-140 Continue Aline Groin assessment per protocol with sheath Trend troponin Assess lipid panel  RENAL A:   At risk for AKI - CK 504 P:   NS at 75 ml/hr Trend BMP / mg/ phos/ urinary output/ daily weights Replace electrolytes as indicated Avoid nephrotoxic agents, ensure adequate renal perfusion  GASTROINTESTINAL A:   No acute issues P:   NPO PPI for SUP  HEMATOLOGIC A:   Elevated H/H- likely from dehydration P:  Trend CBC SCD for VTE ppx for now  INFECTIOUS A:   Slight leukocytosis- no obvious acute process- at risk for aspiration given prolonged altered state at home - Mild fat stranding over the right clavicle per CT  - ua neg, cxr without focal infiltrate  P:   Monitor clinically Trend WBC/ fever curve Assess PCT to be  thorough Per Radiology recs, CT chest to further evaluate fat stranding under right clavicle when stable  ENDOCRINE A:   Hypothyroidism P:   CBG q4 while NPO TSH 2.406 Continue levothyroxine   NEUROLOGIC A:   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    - Inter-disciplinary family meet or Palliative Care meeting due by:  03/09/2017  CCT 40 mins  Posey Boyer, AGACNP-BC [facility] Pgr: [phone] or if no answer [phone] 03/02/2017, 1:57 AM

## 2017-03-02 NOTE — Consult Note (Signed)
PULMONARY / CRITICAL CARE MEDICINE   Name: Haley Montgomery MRN: [id number] DOB: 18-Nov-1955    ADMISSION DATE:  03-28-17 CONSULTATION DATE:  Mar 28, 2017  REFERRING MD:  Dr. Corliss Skains   CHIEF COMPLAINT:  CVA  HISTORY OF PRESENT ILLNESS:  HPI obtained from medical chart review as patient is intubated and sedated; no family present.   61 year old female with PMH of HTN, HLD, and hypothyroidism who presented on 11/22 with right hemiplegia, aphasia and left gaze.  Patient lives with her 25 year old granddaughter.  LSW on Tuesday around 0900.  Later that day, she noticed patient having some slurred speech, left gaze, and altered gait.  Reports that she slept most of Wednesday and today found her ground altered.    In the ED, CT noted for small infarcts in the left parietal cortex and left basal ganglia with small remote left cerebellar infarcts.  CTA head and neck showing complete occlusion of left ICA which was thought to be the cause   CT perfusion study showed hypoperfusion of left cerebral hemisphere with a small infarct core.  Due to onset of symptoms, patient was out of the window for tPA.  Taken to neuro IR for revascularization attempt of left ICA/ Left MCA.  PCCM consulted for ventilator management.    PAST MEDICAL HISTORY :  She  has a past medical history of ESSENTIAL HYPERTENSION (08/11/2009), HYPERLIPIDEMIA (09/14/2008), HYPOTHYROIDISM (09/14/2008), and MRI, BRAIN, ABNORMAL (08/16/2009).  PAST SURGICAL HISTORY: She  has a past surgical history that includes Thyroidectomy (2002).  Allergies  Allergen Reactions  . Doxycycline Anaphylaxis    Sore throat, cough  . Olmesartan Medoxomil Other (See Comments)    Fainting spells (Benicar)    No current facility-administered medications on file prior to encounter.    Current Outpatient Medications on File Prior to Encounter  Medication Sig  . ketorolac (ACULAR) 0.4 % SOLN   . ofloxacin (OCUFLOX) 0.3 % ophthalmic solution   . prednisoLONE  acetate (PRED FORTE) 1 % ophthalmic suspension   . SYNTHROID 112 MCG tablet TAKE 1 TABLET(112 MCG) BY MOUTH DAILY BEFORE BREAKFAST  . levothyroxine (SYNTHROID, LEVOTHROID) 100 MCG tablet Take daily (Patient not taking: Reported on 2017/03/28)    FAMILY HISTORY:  Her indicated that her mother is alive. She indicated that her father is deceased. She indicated that the status of her brother is unknown. She indicated that her maternal grandmother is deceased. She indicated that her maternal grandfather is deceased. She indicated that her paternal grandmother is deceased. She indicated that her paternal grandfather is deceased. She indicated that her daughter is deceased.   SOCIAL HISTORY: She  reports that she quit smoking about 14 years ago. Her smoking use included cigarettes. She has a 15.00 pack-year smoking history. she has never used smokeless tobacco. She reports that she drinks alcohol. She reports that she does not use drugs.   SUBJECTIVE:  Stents placed x2 Limited success cardene drip off  VITAL SIGNS: BP 131/62   Pulse (!) 51   Temp 99.6 F (37.6 C) (Axillary)   Resp (!) 22   Ht 5\' 2"  (1.575 m)   Wt 71.3 kg (157 lb 3 oz)   SpO2 100%   BMI 28.75 kg/m   HEMODYNAMICS:    VENTILATOR SETTINGS: Vent Mode: PRVC FiO2 (%):  [30 %-40 %] 30 % Set Rate:  [16 bmp] 16 bmp Vt Set:  [450 mL] 450 mL PEEP:  [5 cmH20] 5 cmH20 Plateau Pressure:  [16 cmH20-18 cmH20] 16  cmH20  INTAKE / OUTPUT: I/O last 3 completed shifts: In: 2747.3 [I.V.:1747.3; IV Piggyback:1000] Out: 880 [Urine:820; Blood:60]  PHYSICAL EXAMINATION: General: rass -1 Neuro: perrl, moving left with purpose, not fc, not opening eyes spont HEENT: ett PULM: reduced CV:  s1 s2 RRR no r GI: soft, bs wnl, no r Extremities: gen edema, non pit   LABS:  BMET Recent Labs  Lab 02/28/2017 1503 02/24/2017 1512 02/17/2017 2242 03/02/17 0501  NA 140 142 143 139  K 4.4 4.4 3.7 3.7  CL 106 106  --  110  CO2 24  --   --   21*  BUN 16 18  --  11  CREATININE 0.91 0.80  --  0.79  GLUCOSE 120* 119*  --  131*    Electrolytes Recent Labs  Lab 02/10/2017 1503 03/02/17 0501  CALCIUM 9.4 8.1*    CBC Recent Labs  Lab 02/21/2017 1503 03/04/2017 1512 02/14/2017 2242 03/02/17 0501  WBC 12.0*  --   --  9.0  HGB 16.9* 16.7* 12.2 13.1  HCT 48.7* 49.0* 36.0 38.7  PLT 189  --   --  166    Coag's Recent Labs  Lab 02/21/2017 1503  APTT 27  INR 1.01    Sepsis Markers Recent Labs  Lab 03/02/17 0501  PROCALCITON <0.10    ABG Recent Labs  Lab 02/16/2017 2242 03/02/17 0108  PHART 7.329* 7.390  PCO2ART 43.5 36.5  PO2ART 204.0* 72.0*    Liver Enzymes Recent Labs  Lab 02/11/2017 1503  AST 27  ALT 14  ALKPHOS 94  BILITOT 1.0  ALBUMIN 4.0    Cardiac Enzymes Recent Labs  Lab 03/02/17 0501  TROPONINI 0.04*    Glucose No results for input(s): GLUCAP in the last 168 hours.  Imaging Ct Angio Head W Or Wo Contrast  Result Date: 02/18/2017 CLINICAL DATA:  Arterial stricture or occlusion. Slurred speech left base and altered gait. EXAM: CT ANGIOGRAPHY HEAD AND NECK CT PERFUSION BRAIN TECHNIQUE: Multidetector CT imaging of the head and neck was performed using the standard protocol during bolus administration of intravenous contrast. Multiplanar CT image reconstructions and MIPs were obtained to evaluate the vascular anatomy. Carotid stenosis measurements (when applicable) are obtained utilizing NASCET criteria, using the distal internal carotid diameter as the denominator. Multiphase CT imaging of the brain was performed following IV bolus contrast injection. Subsequent parametric perfusion maps were calculated using RAPID software. CONTRAST:  ISOVUE-370 IOPAMIDOL (ISOVUE-370) INJECTION 76% COMPARISON:  Brain MRI 08/13/2009 FINDINGS: CTA NECK FINDINGS Aortic arch: Atheromatous plaque. No acute finding or visible dilatation. Right carotid system: Atheromatous wall thickening of the brachiocephalic.  Calcified and noncalcified plaque at the common carotid bifurcation and ICA bulb without flow limiting stenosis or ulceration. Left carotid system: Atheromatous plaque at the common carotid origin and distal segment. There is calcified plaque at the ICA bulb with occlusion continuing throughout the neck. Good ECA patency. A left occipital collateral servers the left vertebral artery that is flowing retrograde. Vertebral arteries: Proximal left subclavian occlusion. There is less intense flow into the left subclavian and lower vertebral artery, steal phenomenon. Atherosclerosis of the brachiocephalic and right subclavian artery without flow limiting stenosis. There is a focal high-grade narrowing of the right V1 segment. Skeleton: No acute or aggressive finding. Other neck: Thyroidectomy.  No acute finding. Upper chest: Centrilobular micro nodularity, suspect this is smoking-related. There is stranding over the right upper chest wall fat, nonspecific. Review of the MIP images confirms the above findings CTA HEAD  FINDINGS Anterior circulation: The left ICA reconstitutes at the proximal cavernous segment. There is less intense opacification of the left ICA and downstream vessels. At tiny anterior communicating artery is present on coronal reformats. The left PCA is fetal type, as below. Proximal left M2 branch stenosis. No MCA or ACA proximal branch occlusion is seen. Atherosclerosis of the right carotid siphon. No right-sided branch occlusion is seen. Posterior circulation: Right dominant vertebral artery. Minimal flow seen in the left V4 segment, essentially occluded at the dura. The left vertebral artery reconstitutes from the occipital artery of the ECA. The basilar is small in the setting of bilateral fetal type PCA. There is superimposed advanced tandem narrowing of the left P2 segment with poor downstream flow. Venous sinuses: As permitted by contrast timing, patent. Anatomic variants: As above Delayed phase: No  abnormal intracranial enhancement. Review of the MIP images confirms the above findings CT Brain Perfusion Findings: CBF (<30%) Volume: 10mL. This is underestimated as parietal cortex and left basal ganglia infarcts are not included. Perfusion (Tmax>6.0s) volume: . Left ACA, MCA, and PCA distributions are diffusely involved given the essentially isolated left ICA and fetal type left PCA. Mismatch Volume: Overestimated as above. Infarction Location:Left parietal white matter by perfusion. Left parietal cortex and basal ganglia by noncontrast CT. These results were called by telephone at the time of interpretation on 02/11/2017 at 4:31 pm to Dr. Melene Plan , who verbally acknowledged these results. IMPRESSION: 1. 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 The left ICA is nearly isolated from the circle-of-Willis given a tiny anterior communicating artery and fetal type left PCA that has superimposed tandem high-grade stenoses. There is underfilling of vessels to the left cerebral hemisphere with ischemia/delayed flow by perfusion. Cerebral perfusion estimates a 10 cc left parietal white matter infarct that is underestimated - there is left parietal cortex and left lateral lenticulostriate infarcts by noncontrast CT. 2. Left proximal subclavian occlusion with less intense/delayed downstream opacification and implied retrograde flow in the left vertebral artery. The left vertebral artery is essentially occluded at the V4 segment and this flow is retrograde via an occipital collateral. 3. High-grade proximal right V1 segment stenosis. 4. Mild fat stranding over the right clavicle, please correlate for clinical findings of contusion or cellulitis. Electronically Signed   By: Marnee Spring M.D.   On: 02/27/2017 16:50   Ct Head Wo Contrast  Result Date: 03/02/2017 CLINICAL DATA:  Status post stroke intervention EXAM: CT HEAD WITHOUT CONTRAST TECHNIQUE: Contiguous axial images were  obtained from the base of the skull through the vertex without intravenous contrast. COMPARISON:  Head CT 03/09/2017 at 4:03 p.m. FINDINGS: Brain: Focal hypodensity of the left basal ganglia and left occipital lobe are unchanged. There are old left cerebellar infarcts. There is no intracranial hemorrhage or contrast staining. No midline shift or other mass effect. No hydrocephalus. Vascular: There is intravascular contrast enhancement from retained contrast from earlier studies. Skull: Normal calvarium and skull base. Sinuses/Orbits: Moderate maxillary, sphenoid and ethmoid sinus opacification. Normal orbits. Other: None IMPRESSION: 1. No intracranial hemorrhage or contrast extravasation/staining. 2. Unchanged appearance of focal areas of hypoattenuation in the left occipital lobe and left lenticulostriate, which may indicate a recent infarcts. Electronically Signed   By: Deatra Robinson M.D.   On: 03/02/2017 00:08   Ct Head Wo Contrast  Result Date: 02/15/2017 CLINICAL DATA:  Slurred speech and left-sided gaze. Altered gait. Last seen normal on tuesday. EXAM: CT HEAD WITHOUT CONTRAST TECHNIQUE: Contiguous axial images  were obtained from the base of the skull through the vertex without intravenous contrast. COMPARISON:  08/13/2009 FINDINGS: Brain: Cytotoxic type edema without volume loss seen in the left occipital parietal cortex, posterior MCA border zone territory. There is also a recent appearing infarct in the left basal ganglia and corona radiata. Patchy chronic microvascular ischemic type change in the cerebral white matter. Small remote left cerebellar infarcts. No hemorrhage, hydrocephalus, or masslike finding. Vascular: No hyperdense vessel.  Atherosclerotic calcification. Skull: Normal. Negative for fracture or focal lesion. Sinuses/Orbits: Negative IMPRESSION: 1. Recent appearing small infarcts in the left parietal cortex (posterior MCA watershed) and left basal ganglia (lateral lenticulostriate). CTA  and cerebral perfusion or pending. 2. Small remote left cerebellar infarcts. Presumed chronic small vessel ischemic in the cerebral white matter. Electronically Signed   By: Marnee SpringJonathon  Watts M.D.   On: 06/21/2016 16:12   Ct Angio Neck W And/or Wo Contrast  Result Date: 06/21/2016 CLINICAL DATA:  Arterial stricture or occlusion. Slurred speech left base and altered gait. EXAM: CT ANGIOGRAPHY HEAD AND NECK CT PERFUSION BRAIN TECHNIQUE: Multidetector CT imaging of the head and neck was performed using the standard protocol during bolus administration of intravenous contrast. Multiplanar CT image reconstructions and MIPs were obtained to evaluate the vascular anatomy. Carotid stenosis measurements (when applicable) are obtained utilizing NASCET criteria, using the distal internal carotid diameter as the denominator. Multiphase CT imaging of the brain was performed following IV bolus contrast injection. Subsequent parametric perfusion maps were calculated using RAPID software. CONTRAST:  100mL ISOVUE-370 IOPAMIDOL (ISOVUE-370) INJECTION 76% COMPARISON:  Brain MRI 08/13/2009 FINDINGS: CTA NECK FINDINGS Aortic arch: Atheromatous plaque. No acute finding or visible dilatation. Right carotid system: Atheromatous wall thickening of the brachiocephalic. Calcified and noncalcified plaque at the common carotid bifurcation and ICA bulb without flow limiting stenosis or ulceration. Left carotid system: Atheromatous plaque at the common carotid origin and distal segment. There is calcified plaque at the ICA bulb with occlusion continuing throughout the neck. Good ECA patency. A left occipital collateral servers the left vertebral artery that is flowing retrograde. Vertebral arteries: Proximal left subclavian occlusion. There is less intense flow into the left subclavian and lower vertebral artery, steal phenomenon. Atherosclerosis of the brachiocephalic and right subclavian artery without flow limiting stenosis. There is a  focal high-grade narrowing of the right V1 segment. Skeleton: No acute or aggressive finding. Other neck: Thyroidectomy.  No acute finding. Upper chest: Centrilobular micro nodularity, suspect this is smoking-related. There is stranding over the right upper chest wall fat, nonspecific. Review of the MIP images confirms the above findings CTA HEAD FINDINGS Anterior circulation: The left ICA reconstitutes at the proximal cavernous segment. There is less intense opacification of the left ICA and downstream vessels. At tiny anterior communicating artery is present on coronal reformats. The left PCA is fetal type, as below. Proximal left M2 branch stenosis. No MCA or ACA proximal branch occlusion is seen. Atherosclerosis of the right carotid siphon. No right-sided branch occlusion is seen. Posterior circulation: Right dominant vertebral artery. Minimal flow seen in the left V4 segment, essentially occluded at the dura. The left vertebral artery reconstitutes from the occipital artery of the ECA. The basilar is small in the setting of bilateral fetal type PCA. There is superimposed advanced tandem narrowing of the left P2 segment with poor downstream flow. Venous sinuses: As permitted by contrast timing, patent. Anatomic variants: As above Delayed phase: No abnormal intracranial enhancement. Review of the MIP images confirms the above findings CT Brain  Perfusion Findings: CBF (<30%) Volume: 10mL. This is underestimated as parietal cortex and left basal ganglia infarcts are not included. Perfusion (Tmax>6.0s) volume: . Left ACA, MCA, and PCA distributions are diffusely involved given the essentially isolated left ICA and fetal type left PCA. Mismatch Volume: Overestimated as above. Infarction Location:Left parietal white matter by perfusion. Left parietal cortex and basal ganglia by noncontrast CT. These results were called by telephone at the time of interpretation on 03-13-2017 at 4:31 pm to Dr. Melene Plan , who  verbally acknowledged these results. IMPRESSION: 1. 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 The left ICA is nearly isolated from the circle-of-Willis given a tiny anterior communicating artery and fetal type left PCA that has superimposed tandem high-grade stenoses. There is underfilling of vessels to the left cerebral hemisphere with ischemia/delayed flow by perfusion. Cerebral perfusion estimates a 10 cc left parietal white matter infarct that is underestimated - there is left parietal cortex and left lateral lenticulostriate infarcts by noncontrast CT. 2. Left proximal subclavian occlusion with less intense/delayed downstream opacification and implied retrograde flow in the left vertebral artery. The left vertebral artery is essentially occluded at the V4 segment and this flow is retrograde via an occipital collateral. 3. High-grade proximal right V1 segment stenosis. 4. Mild fat stranding over the right clavicle, please correlate for clinical findings of contusion or cellulitis. Electronically Signed   By: Marnee Spring M.D.   On: 13-Mar-2017 16:50   Ct Cerebral Perfusion W Contrast  Result Date: Mar 13, 2017 CLINICAL DATA:  Arterial stricture or occlusion. Slurred speech left base and altered gait. EXAM: CT ANGIOGRAPHY HEAD AND NECK CT PERFUSION BRAIN TECHNIQUE: Multidetector CT imaging of the head and neck was performed using the standard protocol during bolus administration of intravenous contrast. Multiplanar CT image reconstructions and MIPs were obtained to evaluate the vascular anatomy. Carotid stenosis measurements (when applicable) are obtained utilizing NASCET criteria, using the distal internal carotid diameter as the denominator. Multiphase CT imaging of the brain was performed following IV bolus contrast injection. Subsequent parametric perfusion maps were calculated using RAPID software. CONTRAST:  ISOVUE-370 IOPAMIDOL (ISOVUE-370) INJECTION 76% COMPARISON:   Brain MRI 08/13/2009 FINDINGS: CTA NECK FINDINGS Aortic arch: Atheromatous plaque. No acute finding or visible dilatation. Right carotid system: Atheromatous wall thickening of the brachiocephalic. Calcified and noncalcified plaque at the common carotid bifurcation and ICA bulb without flow limiting stenosis or ulceration. Left carotid system: Atheromatous plaque at the common carotid origin and distal segment. There is calcified plaque at the ICA bulb with occlusion continuing throughout the neck. Good ECA patency. A left occipital collateral servers the left vertebral artery that is flowing retrograde. Vertebral arteries: Proximal left subclavian occlusion. There is less intense flow into the left subclavian and lower vertebral artery, steal phenomenon. Atherosclerosis of the brachiocephalic and right subclavian artery without flow limiting stenosis. There is a focal high-grade narrowing of the right V1 segment. Skeleton: No acute or aggressive finding. Other neck: Thyroidectomy.  No acute finding. Upper chest: Centrilobular micro nodularity, suspect this is smoking-related. There is stranding over the right upper chest wall fat, nonspecific. Review of the MIP images confirms the above findings CTA HEAD FINDINGS Anterior circulation: The left ICA reconstitutes at the proximal cavernous segment. There is less intense opacification of the left ICA and downstream vessels. At tiny anterior communicating artery is present on coronal reformats. The left PCA is fetal type, as below. Proximal left M2 branch stenosis. No MCA or ACA proximal branch occlusion is  seen. Atherosclerosis of the right carotid siphon. No right-sided branch occlusion is seen. Posterior circulation: Right dominant vertebral artery. Minimal flow seen in the left V4 segment, essentially occluded at the dura. The left vertebral artery reconstitutes from the occipital artery of the ECA. The basilar is small in the setting of bilateral fetal type PCA.  There is superimposed advanced tandem narrowing of the left P2 segment with poor downstream flow. Venous sinuses: As permitted by contrast timing, patent. Anatomic variants: As above Delayed phase: No abnormal intracranial enhancement. Review of the MIP images confirms the above findings CT Brain Perfusion Findings: CBF (<30%) Volume: 10mL. This is underestimated as parietal cortex and left basal ganglia infarcts are not included. Perfusion (Tmax>6.0s) volume: . Left ACA, MCA, and PCA distributions are diffusely involved given the essentially isolated left ICA and fetal type left PCA. Mismatch Volume: Overestimated as above. Infarction Location:Left parietal white matter by perfusion. Left parietal cortex and basal ganglia by noncontrast CT. These results were called by telephone at the time of interpretation on 02/22/2017 at 4:31 pm to Dr. Melene Plan , who verbally acknowledged these results. IMPRESSION: 1. 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 The left ICA is nearly isolated from the circle-of-Willis given a tiny anterior communicating artery and fetal type left PCA that has superimposed tandem high-grade stenoses. There is underfilling of vessels to the left cerebral hemisphere with ischemia/delayed flow by perfusion. Cerebral perfusion estimates a 10 cc left parietal white matter infarct that is underestimated - there is left parietal cortex and left lateral lenticulostriate infarcts by noncontrast CT. 2. Left proximal subclavian occlusion with less intense/delayed downstream opacification and implied retrograde flow in the left vertebral artery. The left vertebral artery is essentially occluded at the V4 segment and this flow is retrograde via an occipital collateral. 3. High-grade proximal right V1 segment stenosis. 4. Mild fat stranding over the right clavicle, please correlate for clinical findings of contusion or cellulitis. Electronically Signed   By: Marnee Spring  M.D.   On: 02/13/2017 16:50   Portable Chest Xray  Result Date: 03/02/2017 CLINICAL DATA:  Endotracheal tube position EXAM: PORTABLE CHEST 1 VIEW COMPARISON:  None. FINDINGS: The endotracheal tube tip is at the level of the clavicular heads. Orogastric tube tip and side-port in the stomach. Normal cardiomediastinal contours. Mild peribronchial opacities without pulmonary edema or focal consolidation. IMPRESSION: Radiographically appropriate position of endotracheal tube at the level of the clavicular heads. Electronically Signed   By: Deatra Robinson M.D.   On: 03/02/2017 01:16   STUDIES:  CT Head 11/22 >> 1. Recent appearing small infarcts in the left parietal cortex (posterior MCA watershed) and left basal ganglia (lateral lenticulostriate). CTA and cerebral perfusion or pending. 2. Small remote left cerebellar infarcts. Presumed chronic small vessel ischemic in the cerebral white matter.  CTA head/ neck/ perfusion 11/22 >> 1. 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 The left ICA is nearly isolated from the circle-of-Willis given a tiny anterior communicating artery and fetal type left PCA that has superimposed tandem high-grade stenoses. There is underfilling of vessels to the left cerebral hemisphere with ischemia/delayed flow by perfusion. Cerebral perfusion estimates a 10 cc left parietal white matter infarct that is underestimated - there is left parietal cortex and left lateral lenticulostriate infarcts by noncontrast CT. 2. Left proximal subclavian occlusion with less intense/delayed downstream opacification and implied retrograde flow in the left vertebral artery. The left vertebral artery is essentially occluded at the V4  segment and this flow is retrograde via an occipital collateral. 3. High-grade proximal right V1 segment stenosis. 4. Mild fat stranding over the right clavicle, please correlate for clinical findings of contusion or  cellulitis.  CT head 11/22 s/p procedure >>  1. No intracranial hemorrhage or contrast extravasation/staining. 2. 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  LSW 11/20.  Taken to neuro IR for ICA stent but unable to revascularize L MCA.  ASSESSMENT / PLAN:  PULMONARY A: Acute respiratory insufficiency in the setting of CVA Chronic int changes P:   ABG reviewed, keep same MV On min O2 needs Weaning this am cpap 5 ps5 , goal 2 hours, for MRI Flat now for sheeth Assess cough, gag No wheezing   CARDIOVASCULAR A:  Hx HTN 2011 normal LV fxn P:  Tele sheeth to dc in afternoon I hope Trop neg, dc further Echo 2011 reviewed, repeat needed ecg without st changes, does have conduction delay, - getting echo to assess cardiomyaopthy   RENAL A:   At risk for AKI - CK 504 P:   NS at 75 ml/hr High risk brain edema, NO free water  GASTROINTESTINAL A:   No acute issues P:   NPO PPI for SUP Will feed if not extubated  HEMATOLOGIC A:   Elevated H/H- likely from dehydration P:  Trend CBC SCD for VTE ppx for now sheeth removal  INFECTIOUS A:   No evidence infection - Mild fat stranding over the right clavicle per CT  P:   Pct neg, temp down - no role abx Per Radiology recs, CT chest to further evaluate fat stranding under right clavicle when stable Control any temps, goal less 37 c  ENDOCRINE A:   Hypothyroidism P:   CBG q4 synthroid  NEUROLOGIC A:   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 Tyson AliasFeinstein, MD, FACP Pgr: [phone] [facility]

## 2017-03-02 NOTE — Progress Notes (Signed)
Referring Physician(s): Dr Jerel Shepherd  Supervising Physician: Julieanne Cotton  Patient Status:  [facility] - In-pt  Chief Complaint:  CVA  S/P bilateral common carotid and RT vertebral arteriograms. RT CFA approach. Findings. 1.Occluded LT ICA  prox and at the terminus with occlusion of the Lt MCA prox. Revascularization  Of Lt ICA prox  With stent assisted angioplasty and rescue pharmacological thrombolysis of of acute platelet aggregation within the stents  With 10 mg of IA REOPRO and  8.5mg  of INTEGRELIN.. 2.S/P x passes with solitaire 4mm x 40 mm retrieval device of the occluded Lt MCA  with no sig recanalization.   Subjective:  Intubated Vent Sedated Moves left side well to stimulation Right leg slow to move  Allergies: Doxycycline and Olmesartan medoxomil  Medications: Prior to Admission medications   Medication Sig Start Date End Date Taking? Authorizing Provider  ketorolac (ACULAR) 0.4 % SOLN  02/13/17  Yes [provider]  ofloxacin (OCUFLOX) 0.3 % ophthalmic solution  02/12/17  Yes [provider]  prednisoLONE acetate (PRED FORTE) 1 % ophthalmic suspension  02/12/17  Yes [provider]  SYNTHROID 112 MCG tablet TAKE 1 TABLET(112 MCG) BY MOUTH DAILY BEFORE BREAKFAST 02/15/17  Yes Carlus Pavlov, MD  levothyroxine (SYNTHROID, LEVOTHROID) 100 MCG tablet Take daily Patient not taking: Reported on 2017/03/29 11/13/16   Carlus Pavlov, MD     Vital Signs: BP 131/62   Pulse (!) 51   Temp 99.6 F (37.6 C) (Axillary)   Resp (!) 22   Ht 5\' 2"  (1.575 m)   Wt 157 lb 3 oz (71.3 kg)   SpO2 100%   BMI 28.75 kg/m   Physical Exam  Abdominal: Soft.  Musculoskeletal:  Moving left side to stimulation Not to command Right leg moves minimally; slowly to stimulation No movement on right arm  No other response Does not open eyes  Skin: Skin is warm and dry.  Rt groin sheath intact No bleeding No hematoma Rt foot 2+ pulses  Nursing note  and vitals reviewed.   Imaging: Ct Angio Head W Or Wo Contrast  Result Date: 2017-03-29 CLINICAL DATA:  Arterial stricture or occlusion. Slurred speech left base and altered gait. EXAM: CT ANGIOGRAPHY HEAD AND NECK CT PERFUSION BRAIN TECHNIQUE: Multidetector CT imaging of the head and neck was performed using the standard protocol during bolus administration of intravenous contrast. Multiplanar CT image reconstructions and MIPs were obtained to evaluate the vascular anatomy. Carotid stenosis measurements (when applicable) are obtained utilizing NASCET criteria, using the distal internal carotid diameter as the denominator. Multiphase CT imaging of the brain was performed following IV bolus contrast injection. Subsequent parametric perfusion maps were calculated using RAPID software. CONTRAST:  ISOVUE-370 IOPAMIDOL (ISOVUE-370) INJECTION 76% COMPARISON:  Brain MRI 08/13/2009 FINDINGS: CTA NECK FINDINGS Aortic arch: Atheromatous plaque. No acute finding or visible dilatation. Right carotid system: Atheromatous wall thickening of the brachiocephalic. Calcified and noncalcified plaque at the common carotid bifurcation and ICA bulb without flow limiting stenosis or ulceration. Left carotid system: Atheromatous plaque at the common carotid origin and distal segment. There is calcified plaque at the ICA bulb with occlusion continuing throughout the neck. Good ECA patency. A left occipital collateral servers the left vertebral artery that is flowing retrograde. Vertebral arteries: Proximal left subclavian occlusion. There is less intense flow into the left subclavian and lower vertebral artery, steal phenomenon. Atherosclerosis of the brachiocephalic and right subclavian artery without flow limiting stenosis. There is a focal high-grade narrowing of the  right V1 segment. Skeleton: No acute or aggressive finding. Other neck: Thyroidectomy.  No acute finding. Upper chest: Centrilobular micro nodularity, suspect  this is smoking-related. There is stranding over the right upper chest wall fat, nonspecific. Review of the MIP images confirms the above findings CTA HEAD FINDINGS Anterior circulation: The left ICA reconstitutes at the proximal cavernous segment. There is less intense opacification of the left ICA and downstream vessels. At tiny anterior communicating artery is present on coronal reformats. The left PCA is fetal type, as below. Proximal left M2 branch stenosis. No MCA or ACA proximal branch occlusion is seen. Atherosclerosis of the right carotid siphon. No right-sided branch occlusion is seen. Posterior circulation: Right dominant vertebral artery. Minimal flow seen in the left V4 segment, essentially occluded at the dura. The left vertebral artery reconstitutes from the occipital artery of the ECA. The basilar is small in the setting of bilateral fetal type PCA. There is superimposed advanced tandem narrowing of the left P2 segment with poor downstream flow. Venous sinuses: As permitted by contrast timing, patent. Anatomic variants: As above Delayed phase: No abnormal intracranial enhancement. Review of the MIP images confirms the above findings CT Brain Perfusion Findings: CBF (<30%) Volume: 10mL. This is underestimated as parietal cortex and left basal ganglia infarcts are not included. Perfusion (Tmax>6.0s) volume: . Left ACA, MCA, and PCA distributions are diffusely involved given the essentially isolated left ICA and fetal type left PCA. Mismatch Volume: Overestimated as above. Infarction Location:Left parietal white matter by perfusion. Left parietal cortex and basal ganglia by noncontrast CT. These results were called by telephone at the time of interpretation on Mar 13, 2017 at 4:31 pm to Dr. Melene Plan , who verbally acknowledged these results. IMPRESSION: 1. 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 The left ICA is nearly isolated from the circle-of-Willis given a tiny  anterior communicating artery and fetal type left PCA that has superimposed tandem high-grade stenoses. There is underfilling of vessels to the left cerebral hemisphere with ischemia/delayed flow by perfusion. Cerebral perfusion estimates a 10 cc left parietal white matter infarct that is underestimated - there is left parietal cortex and left lateral lenticulostriate infarcts by noncontrast CT. 2. Left proximal subclavian occlusion with less intense/delayed downstream opacification and implied retrograde flow in the left vertebral artery. The left vertebral artery is essentially occluded at the V4 segment and this flow is retrograde via an occipital collateral. 3. High-grade proximal right V1 segment stenosis. 4. Mild fat stranding over the right clavicle, please correlate for clinical findings of contusion or cellulitis. Electronically Signed   By: Marnee Spring M.D.   On: 03/13/2017 16:50   Ct Head Wo Contrast  Result Date: 03/02/2017 CLINICAL DATA:  Status post stroke intervention EXAM: CT HEAD WITHOUT CONTRAST TECHNIQUE: Contiguous axial images were obtained from the base of the skull through the vertex without intravenous contrast. COMPARISON:  Head CT 03-13-2017 at 4:03 p.m. FINDINGS: Brain: Focal hypodensity of the left basal ganglia and left occipital lobe are unchanged. There are old left cerebellar infarcts. There is no intracranial hemorrhage or contrast staining. No midline shift or other mass effect. No hydrocephalus. Vascular: There is intravascular contrast enhancement from retained contrast from earlier studies. Skull: Normal calvarium and skull base. Sinuses/Orbits: Moderate maxillary, sphenoid and ethmoid sinus opacification. Normal orbits. Other: None IMPRESSION: 1. No intracranial hemorrhage or contrast extravasation/staining. 2. Unchanged appearance of focal areas of hypoattenuation in the left occipital lobe and left lenticulostriate, which may indicate a recent infarcts. Electronically  Signed   By: Deatra RobinsonKevin  Herman M.D.   On: 03/02/2017 00:08   Ct Head Wo Contrast  Result Date: 2016/07/06 CLINICAL DATA:  Slurred speech and left-sided gaze. Altered gait. Last seen normal on tuesday. EXAM: CT HEAD WITHOUT CONTRAST TECHNIQUE: Contiguous axial images were obtained from the base of the skull through the vertex without intravenous contrast. COMPARISON:  08/13/2009 FINDINGS: Brain: Cytotoxic type edema without volume loss seen in the left occipital parietal cortex, posterior MCA border zone territory. There is also a recent appearing infarct in the left basal ganglia and corona radiata. Patchy chronic microvascular ischemic type change in the cerebral white matter. Small remote left cerebellar infarcts. No hemorrhage, hydrocephalus, or masslike finding. Vascular: No hyperdense vessel.  Atherosclerotic calcification. Skull: Normal. Negative for fracture or focal lesion. Sinuses/Orbits: Negative IMPRESSION: 1. Recent appearing small infarcts in the left parietal cortex (posterior MCA watershed) and left basal ganglia (lateral lenticulostriate). CTA and cerebral perfusion or pending. 2. Small remote left cerebellar infarcts. Presumed chronic small vessel ischemic in the cerebral white matter. Electronically Signed   By: Marnee SpringJonathon  Watts M.D.   On: 2016/07/06 16:12   Ct Angio Neck W And/or Wo Contrast  Result Date: 2016/07/06 CLINICAL DATA:  Arterial stricture or occlusion. Slurred speech left base and altered gait. EXAM: CT ANGIOGRAPHY HEAD AND NECK CT PERFUSION BRAIN TECHNIQUE: Multidetector CT imaging of the head and neck was performed using the standard protocol during bolus administration of intravenous contrast. Multiplanar CT image reconstructions and MIPs were obtained to evaluate the vascular anatomy. Carotid stenosis measurements (when applicable) are obtained utilizing NASCET criteria, using the distal internal carotid diameter as the denominator. Multiphase CT imaging of the brain was  performed following IV bolus contrast injection. Subsequent parametric perfusion maps were calculated using RAPID software. CONTRAST:  100mL ISOVUE-370 IOPAMIDOL (ISOVUE-370) INJECTION 76% COMPARISON:  Brain MRI 08/13/2009 FINDINGS: CTA NECK FINDINGS Aortic arch: Atheromatous plaque. No acute finding or visible dilatation. Right carotid system: Atheromatous wall thickening of the brachiocephalic. Calcified and noncalcified plaque at the common carotid bifurcation and ICA bulb without flow limiting stenosis or ulceration. Left carotid system: Atheromatous plaque at the common carotid origin and distal segment. There is calcified plaque at the ICA bulb with occlusion continuing throughout the neck. Good ECA patency. A left occipital collateral servers the left vertebral artery that is flowing retrograde. Vertebral arteries: Proximal left subclavian occlusion. There is less intense flow into the left subclavian and lower vertebral artery, steal phenomenon. Atherosclerosis of the brachiocephalic and right subclavian artery without flow limiting stenosis. There is a focal high-grade narrowing of the right V1 segment. Skeleton: No acute or aggressive finding. Other neck: Thyroidectomy.  No acute finding. Upper chest: Centrilobular micro nodularity, suspect this is smoking-related. There is stranding over the right upper chest wall fat, nonspecific. Review of the MIP images confirms the above findings CTA HEAD FINDINGS Anterior circulation: The left ICA reconstitutes at the proximal cavernous segment. There is less intense opacification of the left ICA and downstream vessels. At tiny anterior communicating artery is present on coronal reformats. The left PCA is fetal type, as below. Proximal left M2 branch stenosis. No MCA or ACA proximal branch occlusion is seen. Atherosclerosis of the right carotid siphon. No right-sided branch occlusion is seen. Posterior circulation: Right dominant vertebral artery. Minimal flow seen  in the left V4 segment, essentially occluded at the dura. The left vertebral artery reconstitutes from the occipital artery of the ECA. The basilar is small in the setting of bilateral  fetal type PCA. There is superimposed advanced tandem narrowing of the left P2 segment with poor downstream flow. Venous sinuses: As permitted by contrast timing, patent. Anatomic variants: As above Delayed phase: No abnormal intracranial enhancement. Review of the MIP images confirms the above findings CT Brain Perfusion Findings: CBF (<30%) Volume: 10mL. This is underestimated as parietal cortex and left basal ganglia infarcts are not included. Perfusion (Tmax>6.0s) volume: . Left ACA, MCA, and PCA distributions are diffusely involved given the essentially isolated left ICA and fetal type left PCA. Mismatch Volume: Overestimated as above. Infarction Location:Left parietal white matter by perfusion. Left parietal cortex and basal ganglia by noncontrast CT. These results were called by telephone at the time of interpretation on 2017/03/14 at 4:31 pm to Dr. Melene Plan , who verbally acknowledged these results. IMPRESSION: 1. 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 The left ICA is nearly isolated from the circle-of-Willis given a tiny anterior communicating artery and fetal type left PCA that has superimposed tandem high-grade stenoses. There is underfilling of vessels to the left cerebral hemisphere with ischemia/delayed flow by perfusion. Cerebral perfusion estimates a 10 cc left parietal white matter infarct that is underestimated - there is left parietal cortex and left lateral lenticulostriate infarcts by noncontrast CT. 2. Left proximal subclavian occlusion with less intense/delayed downstream opacification and implied retrograde flow in the left vertebral artery. The left vertebral artery is essentially occluded at the V4 segment and this flow is retrograde via an occipital collateral. 3.  High-grade proximal right V1 segment stenosis. 4. Mild fat stranding over the right clavicle, please correlate for clinical findings of contusion or cellulitis. Electronically Signed   By: Marnee Spring M.D.   On: 2017-03-14 16:50   Ct Cerebral Perfusion W Contrast  Result Date: 2017-03-14 CLINICAL DATA:  Arterial stricture or occlusion. Slurred speech left base and altered gait. EXAM: CT ANGIOGRAPHY HEAD AND NECK CT PERFUSION BRAIN TECHNIQUE: Multidetector CT imaging of the head and neck was performed using the standard protocol during bolus administration of intravenous contrast. Multiplanar CT image reconstructions and MIPs were obtained to evaluate the vascular anatomy. Carotid stenosis measurements (when applicable) are obtained utilizing NASCET criteria, using the distal internal carotid diameter as the denominator. Multiphase CT imaging of the brain was performed following IV bolus contrast injection. Subsequent parametric perfusion maps were calculated using RAPID software. CONTRAST:  ISOVUE-370 IOPAMIDOL (ISOVUE-370) INJECTION 76% COMPARISON:  Brain MRI 08/13/2009 FINDINGS: CTA NECK FINDINGS Aortic arch: Atheromatous plaque. No acute finding or visible dilatation. Right carotid system: Atheromatous wall thickening of the brachiocephalic. Calcified and noncalcified plaque at the common carotid bifurcation and ICA bulb without flow limiting stenosis or ulceration. Left carotid system: Atheromatous plaque at the common carotid origin and distal segment. There is calcified plaque at the ICA bulb with occlusion continuing throughout the neck. Good ECA patency. A left occipital collateral servers the left vertebral artery that is flowing retrograde. Vertebral arteries: Proximal left subclavian occlusion. There is less intense flow into the left subclavian and lower vertebral artery, steal phenomenon. Atherosclerosis of the brachiocephalic and right subclavian artery without flow limiting stenosis.  There is a focal high-grade narrowing of the right V1 segment. Skeleton: No acute or aggressive finding. Other neck: Thyroidectomy.  No acute finding. Upper chest: Centrilobular micro nodularity, suspect this is smoking-related. There is stranding over the right upper chest wall fat, nonspecific. Review of the MIP images confirms the above findings CTA HEAD FINDINGS Anterior circulation: The left ICA reconstitutes at  the proximal cavernous segment. There is less intense opacification of the left ICA and downstream vessels. At tiny anterior communicating artery is present on coronal reformats. The left PCA is fetal type, as below. Proximal left M2 branch stenosis. No MCA or ACA proximal branch occlusion is seen. Atherosclerosis of the right carotid siphon. No right-sided branch occlusion is seen. Posterior circulation: Right dominant vertebral artery. Minimal flow seen in the left V4 segment, essentially occluded at the dura. The left vertebral artery reconstitutes from the occipital artery of the ECA. The basilar is small in the setting of bilateral fetal type PCA. There is superimposed advanced tandem narrowing of the left P2 segment with poor downstream flow. Venous sinuses: As permitted by contrast timing, patent. Anatomic variants: As above Delayed phase: No abnormal intracranial enhancement. Review of the MIP images confirms the above findings CT Brain Perfusion Findings: CBF (<30%) Volume: 10mL. This is underestimated as parietal cortex and left basal ganglia infarcts are not included. Perfusion (Tmax>6.0s) volume: 323mL. Left ACA, MCA, and PCA distributions are diffusely involved given the essentially isolated left ICA and fetal type left PCA. Mismatch Volume: Overestimated as above. Infarction Location:Left parietal white matter by perfusion. Left parietal cortex and basal ganglia by noncontrast CT. These results were called by telephone at the time of interpretation on 07-31-2016 at 4:31 pm to Dr. Melene PlanAN FLOYD  , who verbally acknowledged these results. IMPRESSION: 1. 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 The left ICA is nearly isolated from the circle-of-Willis given a tiny anterior communicating artery and fetal type left PCA that has superimposed tandem high-grade stenoses. There is underfilling of vessels to the left cerebral hemisphere with ischemia/delayed flow by perfusion. Cerebral perfusion estimates a 10 cc left parietal white matter infarct that is underestimated - there is left parietal cortex and left lateral lenticulostriate infarcts by noncontrast CT. 2. Left proximal subclavian occlusion with less intense/delayed downstream opacification and implied retrograde flow in the left vertebral artery. The left vertebral artery is essentially occluded at the V4 segment and this flow is retrograde via an occipital collateral. 3. High-grade proximal right V1 segment stenosis. 4. Mild fat stranding over the right clavicle, please correlate for clinical findings of contusion or cellulitis. Electronically Signed   By: Marnee SpringJonathon  Watts M.D.   On: 07-31-2016 16:50   Portable Chest Xray  Result Date: 03/02/2017 CLINICAL DATA:  Endotracheal tube position EXAM: PORTABLE CHEST 1 VIEW COMPARISON:  None. FINDINGS: The endotracheal tube tip is at the level of the clavicular heads. Orogastric tube tip and side-port in the stomach. Normal cardiomediastinal contours. Mild peribronchial opacities without pulmonary edema or focal consolidation. IMPRESSION: Radiographically appropriate position of endotracheal tube at the level of the clavicular heads. Electronically Signed   By: Deatra RobinsonKevin  Herman M.D.   On: 03/02/2017 01:16    Labs:  CBC: Recent Labs    07/27/16 1514 Aug 09, 2016 1503 Aug 09, 2016 1512 Aug 09, 2016 2242 03/02/17 0501  WBC 8.0 12.0*  --   --  9.0  HGB 14.8 16.9* 16.7* 12.2 13.1  HCT 43.4 48.7* 49.0* 36.0 38.7  PLT 237.0 189  --   --  166    COAGS: Recent Labs    Aug 09, 2016 1503    INR 1.01  APTT 27    BMP: Recent Labs    07/27/16 1514 Aug 09, 2016 1503 Aug 09, 2016 1512 Aug 09, 2016 2242 03/02/17 0501  NA 139 140 142 143 139  K 4.2 4.4 4.4 3.7 3.7  CL 106 106 106  --  110  CO2 28 24  --   --  21*  GLUCOSE 78 120* 119*  --  131*  BUN 12 16 18   --  11  CALCIUM 9.5 9.4  --   --  8.1*  CREATININE 0.96 0.91 0.80  --  0.79  GFRNONAA  --  >60  --   --  >60  GFRAA  --  >60  --   --  >60    LIVER FUNCTION TESTS: Recent Labs    07/27/16 1514 03/02/2017 1503  BILITOT 0.4 1.0  AST 14 27  ALT 8 14  ALKPHOS 87 94  PROT 6.9 7.2  ALBUMIN 4.1 4.0    Assessment and Plan:  CVA L ICA angioplasty/stent revascularization 11/22 pm - Dr Corliss Skains Will follow  Electronically Signed: Carver Murakami A, PA-C 03/02/2017, 11:10 AM   I spent a total of 20 min at the the patient's bedside AND on the patient's hospital floor or unit, greater than 50% of which was counseling/coordinating care for L ICA revascularization

## 2017-03-02 NOTE — Progress Notes (Signed)
Initial Nutrition Assessment  INTERVENTION:   Vital High Protein @ 50 ml/hr (1200 ml/day) Provides: 1200 kcal, 105 grams protein, and 1051 ml free water.  TF regimen and propofol at current rate providing 1482 total kcal/day (100 % of kcal needs)  NUTRITION DIAGNOSIS:   Inadequate oral intake related to inability to eat as evidenced by NPO status.  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 Pt is now s/p IR for ICA stent but unable to revascularize L MCA.    Patient is currently intubated on ventilator support MV: 7.3 L/min Temp (24hrs), Avg:98.9 F (37.2 C), Min:98.1 F (36.7 C), Max:99.6 F (37.6 C)  Propofol: 10.7 ml/hr provides: 282 kcal Medications reviewed and include: synthroid  Labs reviewed    NUTRITION - FOCUSED PHYSICAL EXAM:    Most Recent Value  Orbital Region  No depletion  Upper Arm Region  No depletion  Thoracic and Lumbar Region  No depletion  Buccal Region  Unable to assess  Temple Region  No depletion  Clavicle Bone Region  No depletion  Clavicle and Acromion Bone Region  No depletion  Scapular Bone Region  Unable to assess  Dorsal Hand  No depletion  Patellar Region  No depletion  Anterior Thigh Region  No depletion  Posterior Calf Region  No depletion  Edema (RD Assessment)  None  Hair  Reviewed  Eyes  Unable to assess  Mouth  Unable to assess  Skin  Reviewed  Nails  Reviewed       Diet Order:  Diet NPO time specified  EDUCATION NEEDS:   No education needs have been identified at this time  Skin:  Skin Assessment: Reviewed RN Assessment  Last BM:  unknown  Height:   Ht Readings from Last 1 Encounters:  03/02/17 5\' 2"  (1.575 m)    Weight:   Wt Readings from  Last 1 Encounters:  03/02/17 157 lb 3 oz (71.3 kg)    Ideal Body Weight:  50 kg  BMI:  Body mass index is 28.75 kg/m.  Estimated Nutritional Needs:   Kcal:  1479  Protein:  85-105 grams  Fluid:  > 1.5 L/day  Kendell BaneHeather Rochelle Nephew RD, LDN, CNSC [phone] Pager [phone] After Hours Pager

## 2017-03-03 ENCOUNTER — Inpatient Hospital Stay (HOSPITAL_COMMUNITY): Payer: BLUE CROSS/BLUE SHIELD

## 2017-03-03 ENCOUNTER — Encounter (HOSPITAL_COMMUNITY): Payer: Self-pay

## 2017-03-03 DIAGNOSIS — Z978 Presence of other specified devices: Secondary | ICD-10-CM

## 2017-03-03 DIAGNOSIS — I6300 Cerebral infarction due to thrombosis of unspecified precerebral artery: Secondary | ICD-10-CM

## 2017-03-03 DIAGNOSIS — I63412 Cerebral infarction due to embolism of left middle cerebral artery: Secondary | ICD-10-CM

## 2017-03-03 LAB — COMPREHENSIVE METABOLIC PANEL
ALT: 10 U/L — ABNORMAL LOW (ref 14–54)
AST: 21 U/L (ref 15–41)
Albumin: 2.7 g/dL — ABNORMAL LOW (ref 3.5–5.0)
Alkaline Phosphatase: 58 U/L (ref 38–126)
Anion gap: 4 — ABNORMAL LOW (ref 5–15)
BILIRUBIN TOTAL: 0.7 mg/dL (ref 0.3–1.2)
BUN: 16 mg/dL (ref 6–20)
CO2: 26 mmol/L (ref 22–32)
Calcium: 8.4 mg/dL — ABNORMAL LOW (ref 8.9–10.3)
Chloride: 115 mmol/L — ABNORMAL HIGH (ref 101–111)
Creatinine, Ser: 0.76 mg/dL (ref 0.44–1.00)
GFR calc Af Amer: >60 mL/min (ref >60)
Glucose, Bld: 115 mg/dL — ABNORMAL HIGH (ref 65–99)
POTASSIUM: 3.8 mmol/L (ref 3.5–5.1)
Sodium: 145 mmol/L (ref 135–145)
TOTAL PROTEIN: 5 g/dL — AB (ref 6.5–8.1)

## 2017-03-03 LAB — CBC WITH DIFFERENTIAL/PLATELET
Basophils Absolute: 0 10*3/uL (ref 0.0–0.1)
Basophils Relative: 0 %
EOS PCT: 0 %
Eosinophils Absolute: 0 10*3/uL (ref 0.0–0.7)
HEMATOCRIT: 33.6 % — AB (ref 36.0–46.0)
Hemoglobin: 10.9 g/dL — ABNORMAL LOW (ref 12.0–15.0)
LYMPHS ABS: 1.8 10*3/uL (ref 0.7–4.0)
LYMPHS PCT: 18 %
MCH: 31.3 pg (ref 26.0–34.0)
MCHC: 32.4 g/dL (ref 30.0–36.0)
MCV: 96.6 fL (ref 78.0–100.0)
MONO ABS: 1 10*3/uL (ref 0.1–1.0)
MONOS PCT: 10 %
NEUTROS ABS: 7.1 10*3/uL (ref 1.7–7.7)
Neutrophils Relative %: 72 %
PLATELETS: 147 10*3/uL — AB (ref 150–400)
RBC: 3.48 MIL/uL — ABNORMAL LOW (ref 3.87–5.11)
RDW: 13.9 % (ref 11.5–15.5)
WBC: 9.9 10*3/uL (ref 4.0–10.5)

## 2017-03-03 LAB — GLUCOSE, CAPILLARY
Glucose-Capillary: 96 mg/dL (ref 65–99)
Glucose-Capillary: 113 mg/dL — ABNORMAL HIGH (ref 65–99)

## 2017-03-03 LAB — MAGNESIUM: MAGNESIUM: 1.9 mg/dL (ref 1.7–2.4)

## 2017-03-03 LAB — PHOSPHORUS: Phosphorus: 2.1 mg/dL — ABNORMAL LOW (ref 2.5–4.6)

## 2017-03-03 MED ORDER — LORAZEPAM BOLUS VIA INFUSION
2.0000 mg | INTRAVENOUS | Status: DC | PRN
  Filled 2017-03-03: qty 5

## 2017-03-03 MED ORDER — LORAZEPAM 2 MG/ML IJ SOLN
5.0000 mg/h | INTRAVENOUS | Status: DC
  Administered 2017-03-03: 5 mg/h via INTRAVENOUS
  Filled 2017-03-03 (×2): qty 25

## 2017-03-03 MED ORDER — SODIUM CHLORIDE 0.9 % IV SOLN
10.0000 mg/h | INTRAVENOUS | Status: DC
  Administered 2017-03-03: 10 mg/h via INTRAVENOUS
  Filled 2017-03-03: qty 10

## 2017-03-03 MED ORDER — MORPHINE BOLUS VIA INFUSION
5.0000 mg | INTRAVENOUS | Status: DC | PRN
  Filled 2017-03-03: qty 20

## 2017-03-05 ENCOUNTER — Encounter (HOSPITAL_COMMUNITY): Payer: Self-pay | Admitting: Interventional Radiology

## 2017-03-10 NOTE — Consult Note (Signed)
PULMONARY / CRITICAL CARE MEDICINE   Name: Haley Montgomery MRN: [id number] DOB: Oct 01, 1955    ADMISSION DATE:  02/22/2017 CONSULTATION DATE:  03/06/2017  REFERRING MD:  Dr. Corliss Skains   CHIEF COMPLAINT:  CVA  HISTORY OF PRESENT ILLNESS:  HPI obtained from medical chart review as patient is intubated and sedated; no family present.   61 year old female with PMH of HTN, HLD, and hypothyroidism who presented on 11/22 with right hemiplegia, aphasia and left gaze.  Patient lives with her 89 year old granddaughter.  LSW on Tuesday around 0900.  Later that day, she noticed patient having some slurred speech, left gaze, and altered gait.  Reports that she slept most of Wednesday and today found her ground altered.    In the ED, CT noted for small infarcts in the left parietal cortex and left basal ganglia with small remote left cerebellar infarcts.  CTA head and neck showing complete occlusion of left ICA which was thought to be the cause   CT perfusion study showed hypoperfusion of left cerebral hemisphere with a small infarct core.  Due to onset of symptoms, patient was out of the window for tPA.  Taken to neuro IR for revascularization attempt of left ICA/ Left MCA.  PCCM consulted for ventilator management.    SUBJECTIVE:  MRI edema shift 3% started  VITAL SIGNS: BP (!) 153/48 (BP Location: Right Arm)   Pulse (!) 45   Temp 99.3 F (37.4 C) (Axillary)   Resp (!) 22   Ht 5\' 2"  (1.575 m)   Wt 70.2 kg (154 lb 12.2 oz)   SpO2 100%   BMI 28.31 kg/m   HEMODYNAMICS:    VENTILATOR SETTINGS: Vent Mode: PSV;CPAP FiO2 (%):  [30 %] 30 % Set Rate:  [16 bmp] 16 bmp Vt Set:  [450 mL] 450 mL PEEP:  [5 cmH20] 5 cmH20 Pressure Support:  [8 cmH20-10 cmH20] 8 cmH20 Plateau Pressure:  [15 cmH20] 15 cmH20  INTAKE / OUTPUT: I/O last 3 completed shifts: In: 4451.6 [I.V.:3542.4; NG/GT:909.2] Out: 1685 [Urine:1625; Blood:60]  PHYSICAL EXAMINATION: General: rass -1 Neuro: not fc, rass -1, moves  left, perrl slug HEENT: jvd  wnl PULM: CTA CV:  s1 s2 RRBrady GI: soft, BS wnl, no Extremities:  Edema mild    LABS:  BMET Recent Labs  Lab 03/02/2017 1503 03/08/2017 1512 03/09/2017 2242 03/02/17 0501 03/02/17 2241 04/02/2017 0427  NA 140 142 143 139 141 145  K 4.4 4.4 3.7 3.7  --  3.8  CL 106 106  --  110  --  115*  CO2 24  --   --  21*  --  26  BUN 16 18  --  11  --  16  CREATININE 0.91 0.80  --  0.79  --  0.76  GLUCOSE 120* 119*  --  131*  --  115*    Electrolytes Recent Labs  Lab 02/18/2017 1503 03/02/17 0501 03/02/17 1204 03/02/17 1825 04-02-17 0427  CALCIUM 9.4 8.1*  --   --  8.4*  MG  --   --  1.8 1.9 1.9  PHOS  --   --  3.0 2.9 2.1*    CBC Recent Labs  Lab 03/07/2017 1503  03/07/2017 2242 03/02/17 0501 April 02, 2017 0427  WBC 12.0*  --   --  9.0 9.9  HGB 16.9*   < > 12.2 13.1 10.9*  HCT 48.7*   < > 36.0 38.7 33.6*  PLT 189  --   --  166 147*   < > = values in this interval not displayed.    Coag's Recent Labs  Lab 02/13/2017 1503  APTT 27  INR 1.01    Sepsis Markers Recent Labs  Lab 03/02/17 0501  PROCALCITON <0.10    ABG Recent Labs  Lab 02/28/2017 2242 03/02/17 0108  PHART 7.329* 7.390  PCO2ART 43.5 36.5  PO2ART 204.0* 72.0*    Liver Enzymes Recent Labs  Lab 02/26/2017 1503 August 01, 2016 0427  AST 27 21  ALT 14 10*  ALKPHOS 94 58  BILITOT 1.0 0.7  ALBUMIN 4.0 2.7*    Cardiac Enzymes Recent Labs  Lab 03/02/17 0501  TROPONINI 0.04*    Glucose Recent Labs  Lab 03/02/17 1152 03/02/17 1600 03/02/17 2227 03/02/17 2314 August 01, 2016 0308 August 01, 2016 0830  GLUCAP 92 90 95 114* 96 113*    Imaging Mr Brain Wo Contrast  Result Date: 03/02/2017 CLINICAL DATA:  Follow-up examination for acute stroke, status post revascularization. EXAM: MRI HEAD WITHOUT CONTRAST MRA HEAD WITHOUT CONTRAST TECHNIQUE: Multiplanar, multiecho pulse sequences of the brain and surrounding structures were obtained without intravenous contrast. Angiographic images of  the head were obtained using MRA technique without contrast. COMPARISON:  Prior studies from 02/14/2017. FINDINGS: MRI HEAD FINDINGS Brain: Age related cerebral atrophy. Patchy and confluent T2/FLAIR hyperintensity within the periventricular and deep white matter both cerebral hemispheres, most like related chronic small vessel ischemic disease. Several scattered remote lacunar type infarcts present within the bilateral cerebellar hemispheres, left greater than right. Large confluent acute ischemic infarct involving essentially the entirety of the left MCA territory is seen. Involvement of the left frontal, parietal, occipital, and temporal lobes. Left basal ganglia and internal capsule are involved. Associated extensive gyral swelling and edema with localized mass effect. Left lateral ventricle is partially attenuated with new 5 mm of left-to-right shift. Basilar cisterns remain patent. Mild scattered faint petechial hemorrhage without hemorrhagic transformation (series 9, image 15). Additional punctate 4 mm cortical infarct noted within the high right parietal lobe (series 4, image 48). No mass lesion. No extra-axial fluid collection. Pituitary gland suprasellar region normal. Vascular: Abnormal flow voids within the left ICA and MCA, likely occluded. Abnormal flow void also seen within the left vertebral artery. Major intracranial vascular flow voids otherwise maintained. Skull and upper cervical spine: Craniocervical junction normal. Upper cervical spine within normal limits. Bone marrow signal intensity normal. No scalp soft tissue abnormality. Sinuses/Orbits: Globes and orbital soft tissues within normal limits. Scattered mucosal thickening throughout the paranasal sinuses with superimposed air-fluid levels. Patient appears to be intubated. Fluid seen layering within the nasopharynx. Small bilateral mastoid effusions noted. Inner ear structures normal. Other:  None. MRA HEAD FINDINGS ANTERIOR CIRCULATION: Left  ICA is occluded to the level of the terminus. Occlusion of the proximal left MCA and, with no flow seen within the left M1 segment or the distal left MCA branches. Right ICA widely patent to the level of the terminus without flow-limiting stenosis. Right A1 segment widely patent. Patent anterior communicating artery. Both of the anterior cerebral artery is well perfused to their distal aspects without flow-limiting stenosis. Some retrograde filling seen within the left A1 segment. Right M1 segment widely patent without stenosis or occlusion. Normal right MCA bifurcation. No proximal right M2 occlusion. Distal right MCA branches well perfused. POSTERIOR CIRCULATION: Right vertebral artery widely patent to the vertebrobasilar junction without stenosis. Right PICA patent. Left vertebral artery is occluded. Basilar artery widely patent to its distal aspect. Dominant left AICA. Superior cerebral arteries patent  bilaterally. Fetal type origin of the right PCA supplied via a widely patent right posterior communicating artery. Predominant fetal type origin of the left PCA and with scant attenuated flow. No aneurysm. IMPRESSION: MRI HEAD IMPRESSION: 1. Large evolving acute ischemic infarct involving essentially the entirety of the left MCA territory. Mass effect with new 5 mm of left-to-right shift. Associated faint petechial hemorrhage without hemorrhagic transformation. 2. Few scattered underlying chronic bilateral cerebellar lacunar infarcts. MRA HEAD IMPRESSION: 1. Occluded left ICA and MCA. Right ICA widely patent, with good perfusion of the anterior cerebral arteries via the right A1 segment and anterior communicating artery. 2. Occluded left vertebral artery, with widely patent right vertebral and basilar arteries. 3. Predominant fetal type origin of the PCAs, with markedly attenuated flow within the left PCA. Electronically Signed   By: Rise Mu M.D.   On: 03/02/2017 22:08   Portable Chest  Xray  Result Date: 03/22/2017 CLINICAL DATA:  Endotracheal E intubated. EXAM: PORTABLE CHEST 1 VIEW COMPARISON:  03/02/2017 FINDINGS: Endotracheal tube terminates at the inferior margin of the clavicular heads, 3.3 cm above the carina. Enteric tube terminates in the proximal stomach. The cardiomediastinal silhouette is unchanged. Aortic atherosclerosis is noted. Minimal opacity is present in the left lung base, likely atelectasis. No edema, sizable pleural effusion, or pneumothorax is identified. Surgical clips are present in the lower neck. IMPRESSION: Support devices as above.  Minimal left basilar atelectasis. Electronically Signed   By: Sebastian Ache M.D.   On: 03/22/2017 08:17   Mr Maxine Glenn Head Wo Contrast  Result Date: 03/02/2017 CLINICAL DATA:  Follow-up examination for acute stroke, status post revascularization. EXAM: MRI HEAD WITHOUT CONTRAST MRA HEAD WITHOUT CONTRAST TECHNIQUE: Multiplanar, multiecho pulse sequences of the brain and surrounding structures were obtained without intravenous contrast. Angiographic images of the head were obtained using MRA technique without contrast. COMPARISON:  Prior studies from 02/08/2017. FINDINGS: MRI HEAD FINDINGS Brain: Age related cerebral atrophy. Patchy and confluent T2/FLAIR hyperintensity within the periventricular and deep white matter both cerebral hemispheres, most like related chronic small vessel ischemic disease. Several scattered remote lacunar type infarcts present within the bilateral cerebellar hemispheres, left greater than right. Large confluent acute ischemic infarct involving essentially the entirety of the left MCA territory is seen. Involvement of the left frontal, parietal, occipital, and temporal lobes. Left basal ganglia and internal capsule are involved. Associated extensive gyral swelling and edema with localized mass effect. Left lateral ventricle is partially attenuated with new 5 mm of left-to-right shift. Basilar cisterns remain  patent. Mild scattered faint petechial hemorrhage without hemorrhagic transformation (series 9, image 15). Additional punctate 4 mm cortical infarct noted within the high right parietal lobe (series 4, image 48). No mass lesion. No extra-axial fluid collection. Pituitary gland suprasellar region normal. Vascular: Abnormal flow voids within the left ICA and MCA, likely occluded. Abnormal flow void also seen within the left vertebral artery. Major intracranial vascular flow voids otherwise maintained. Skull and upper cervical spine: Craniocervical junction normal. Upper cervical spine within normal limits. Bone marrow signal intensity normal. No scalp soft tissue abnormality. Sinuses/Orbits: Globes and orbital soft tissues within normal limits. Scattered mucosal thickening throughout the paranasal sinuses with superimposed air-fluid levels. Patient appears to be intubated. Fluid seen layering within the nasopharynx. Small bilateral mastoid effusions noted. Inner ear structures normal. Other:  None. MRA HEAD FINDINGS ANTERIOR CIRCULATION: Left ICA is occluded to the level of the terminus. Occlusion of the proximal left MCA and, with no flow seen within the left M1  segment or the distal left MCA branches. Right ICA widely patent to the level of the terminus without flow-limiting stenosis. Right A1 segment widely patent. Patent anterior communicating artery. Both of the anterior cerebral artery is well perfused to their distal aspects without flow-limiting stenosis. Some retrograde filling seen within the left A1 segment. Right M1 segment widely patent without stenosis or occlusion. Normal right MCA bifurcation. No proximal right M2 occlusion. Distal right MCA branches well perfused. POSTERIOR CIRCULATION: Right vertebral artery widely patent to the vertebrobasilar junction without stenosis. Right PICA patent. Left vertebral artery is occluded. Basilar artery widely patent to its distal aspect. Dominant left AICA.  Superior cerebral arteries patent bilaterally. Fetal type origin of the right PCA supplied via a widely patent right posterior communicating artery. Predominant fetal type origin of the left PCA and with scant attenuated flow. No aneurysm. IMPRESSION: MRI HEAD IMPRESSION: 1. Large evolving acute ischemic infarct involving essentially the entirety of the left MCA territory. Mass effect with new 5 mm of left-to-right shift. Associated faint petechial hemorrhage without hemorrhagic transformation. 2. Few scattered underlying chronic bilateral cerebellar lacunar infarcts. MRA HEAD IMPRESSION: 1. Occluded left ICA and MCA. Right ICA widely patent, with good perfusion of the anterior cerebral arteries via the right A1 segment and anterior communicating artery. 2. Occluded left vertebral artery, with widely patent right vertebral and basilar arteries. 3. Predominant fetal type origin of the PCAs, with markedly attenuated flow within the left PCA. Electronically Signed   By: Rise MuBenjamin  McClintock M.D.   On: 03/02/2017 22:08   STUDIES:  CT Head 11/22 >> 1. Recent appearing small infarcts in the left parietal cortex (posterior MCA watershed) and left basal ganglia (lateral lenticulostriate). CTA and cerebral perfusion or pending. 2. Small remote left cerebellar infarcts. Presumed chronic small vessel ischemic in the cerebral white matter.  CTA head/ neck/ perfusion 11/22 >> 1. 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 The left ICA is nearly isolated from the circle-of-Willis given a tiny anterior communicating artery and fetal type left PCA that has superimposed tandem high-grade stenoses. There is underfilling of vessels to the left cerebral hemisphere with ischemia/delayed flow by perfusion. Cerebral perfusion estimates a 10 cc left parietal white matter infarct that is underestimated - there is left parietal cortex and left lateral lenticulostriate infarcts by noncontrast  CT. 2. Left proximal subclavian occlusion with less intense/delayed downstream opacification and implied retrograde flow in the left vertebral artery. The left vertebral artery is essentially occluded at the V4 segment and this flow is retrograde via an occipital collateral. 3. High-grade proximal right V1 segment stenosis. 4. Mild fat stranding over the right clavicle, please correlate for clinical findings of contusion or cellulitis.  CT head 11/22 s/p procedure >>  1. No intracranial hemorrhage or contrast extravasation/staining. 2. Unchanged appearance of focal areas of hypoattenuation in the left occipital lobe and left lenticulostriate, which may indicate a recent infarcts  MRI 11/23>>> massive left mca with edema  CXR 11/23 >>chronic int changes, ett wnl  CULTURES: MRSA PCR 11/23 >>  ANTIBIOTICS: none  SIGNIFICANT EVENTS: 11/22 Admit 11/23 MRi edema, poor prognosis, 3%  LINES/TUBES: 11/22 ETT >> 11/22 OGT >> 11/22 Left Aline >> 11/22 R femoral sheath >>11/23 11/22 Foley >>  DISCUSSION: [age] yoF HTN, HLD, and hypothyroidism who presented on 11/22 with right hemiplegia, aphasia and left gaze found to have L ICA occulusion and L MCA CVA.  LSW 11/20.  Taken to neuro IR for ICA stent but unable to  revascularize L MCA.  ASSESSMENT / PLAN:  PULMONARY A: Acute respiratory insufficiency in the setting of CVA Chronic int changes P:   ABG reviewed, keep MV NO SBT with risk ICP elevation if acidotic pcxr no infiltrate, no repeat needed  CARDIOVASCULAR A:  Hx HTN 2011 normal LV fxn P:  Tele Consider some permissive hypercapnia Echo 2011 reviewed, echo reviewed 60%  RENAL A:   At risk for AKI - CK 504 P:   salne No free water  GASTROINTESTINAL A:   No acute issues P:   NPO PPI for SUP Feed to goal done  HEMATOLOGIC A:   Elevated H/H- likely from dehydration P:  Trend CBC SCD for VTE ppx for now sheeth removal done  INFECTIOUS A:   No  evidence infection - Mild fat stranding over the right clavicle per CT  P:   Keep temp wnl  ENDOCRINE A:   Hypothyroidism P:   CBG q4 synthroid  NEUROLOGIC A:   Left ICA occulusion and L MCA w/ right hemiplegia, left gaze, aphasia - LSW 11/20 at 0900 - s/p neuro IR with placement of L ICA stent w/ use of reopro and integrelin; unable to revascularize L MCA P:   RASS goal: -1 MRI concerning, may herniate? 3% per neuro I think we should consider comfort care  FAMILY  - Updates   - Inter-disciplinary family meet or Palliative Care meeting due by:  03/09/2017  Ccm time 30 min   Mcarthur Rossetti. Tyson Alias, MD, FACP Pgr: [phone] [facility]

## 2017-03-10 NOTE — Progress Notes (Signed)
Patient extubated with the end of life sustaining measures protocol. RN and family at bedside. Patient resting comfortably at this time.

## 2017-03-10 NOTE — Progress Notes (Signed)
PT Cancellation Note  Patient Details Name: Haley HammingKaren V Montgomery MRN: [id number] DOB: 1955/10/09   Cancelled Treatment:    Reason Eval/Treat Not Completed: Patient not medically ready. Pt remains intubated and sedated. Will check back as appropriate.   Ilda FoilGarrow, Helma Argyle Rene 12-27-2016, 8:36 AM

## 2017-03-10 NOTE — Discharge Summary (Signed)
Stroke Discharge Summary  Patient ID: Haley Montgomery   MRN: [id number]      DOB: 1955/12/22  Date of Admission: 02/10/2017 Date of Discharge: 2017/03/25  Attending Physician:  Marvel Plan, MD, Stroke MD Consultant(s):   Treatment Team:  Pccm, Md, MD pulmonary/intensive care Dr Tyson Alias Patient's PCP:  Sheliah Hatch, MD  DISCHARGE DIAGNOSIS: Large evolving acute ischemic infarct involving essentially the entirety of the left MCA territory. Active Problems:   Stroke (cerebrum) (HCC)   Endotracheally intubated   Past Medical History:  Diagnosis Date  . ESSENTIAL HYPERTENSION 08/11/2009  . HYPERLIPIDEMIA 09/14/2008  . HYPOTHYROIDISM 09/14/2008  . MRI, BRAIN, ABNORMAL 08/16/2009   Past Surgical History:  Procedure Laterality Date  . RADIOLOGY WITH ANESTHESIA N/A 02/21/2017   Procedure: RADIOLOGY WITH ANESTHESIA;  Surgeon: Radiologist, Medication, MD;  Location: [facility] OR;  Service: Radiology;  Laterality: N/A;  . THYROIDECTOMY  2002      LABORATORY STUDIES CBC    Component Value Date/Time   WBC 9.9 25-Mar-2017 0427   RBC 3.48 (L) Mar 25, 2017 0427   HGB 10.9 (L) 2017-03-25 0427   HCT 33.6 (L) 2017/03/25 0427   PLT 147 (L) Mar 25, 2017 0427   MCV 96.6 03-25-17 0427   MCH 31.3 Mar 25, 2017 0427   MCHC 32.4 2017-03-25 0427   RDW 13.9 2017-03-25 0427   LYMPHSABS 1.8 25-Mar-2017 0427   MONOABS 1.0 03-25-17 0427   EOSABS 0.0 March 25, 2017 0427   BASOSABS 0.0 03/25/2017 0427   CMP    Component Value Date/Time   NA 145 2017-03-25 0427   K 3.8 Mar 25, 2017 0427   CL 115 (H) Mar 25, 2017 0427   CO2 26 March 25, 2017 0427   GLUCOSE 115 (H) March 25, 2017 0427   BUN 16 03-25-2017 0427   CREATININE 0.76 03-25-2017 0427   CALCIUM 8.4 (L) 2017-03-25 0427   PROT 5.0 (L) 2017-03-25 0427   ALBUMIN 2.7 (L) March 25, 2017 0427   AST 21 03-25-17 0427   ALT 10 (L) Mar 25, 2017 0427   ALKPHOS 58 03-25-2017 0427   BILITOT 0.7 Mar 25, 2017 0427   GFRNONAA >60 03/25/2017 0427   GFRAA >60 03/25/2017  0427   COAGS Lab Results  Component Value Date   INR 1.01 03/06/2017   Lipid Panel    Component Value Date/Time   CHOL 194 03/02/2017 0501   TRIG 133 03/02/2017 0501   HDL 36 (L) 03/02/2017 0501   CHOLHDL 5.4 03/02/2017 0501   VLDL 27 03/02/2017 0501   LDLCALC 131 (H) 03/02/2017 0501   HgbA1C  Lab Results  Component Value Date   HGBA1C 5.4 03/02/2017   Urinalysis    Component Value Date/Time   COLORURINE YELLOW 02/23/2017 1843   APPEARANCEUR CLEAR 02/11/2017 1843   LABSPEC >1.046 (H) 02/16/2017 1843   PHURINE 6.0 03/02/2017 1843   GLUCOSEU NEGATIVE 02/08/2017 1843   HGBUR SMALL (A) 03/04/2017 1843   BILIRUBINUR NEGATIVE 02/16/2017 1843   BILIRUBINUR negative 07/21/2016 1329   KETONESUR 20 (A) 02/26/2017 1843   PROTEINUR 30 (A) 03/07/2017 1843   UROBILINOGEN 0.2 07/21/2016 1329   NITRITE NEGATIVE 03/07/2017 1843   LEUKOCYTESUR NEGATIVE 02/08/2017 1843   Urine Drug Screen     Component Value Date/Time   LABOPIA NONE DETECTED 03/05/2017 1843   COCAINSCRNUR NONE DETECTED 02/12/2017 1843   LABBENZ NONE DETECTED 02/27/2017 1843   AMPHETMU NONE DETECTED 02/13/2017 1843   THCU NONE DETECTED 02/16/2017 1843   LABBARB NONE DETECTED 02/10/2017 1843    Alcohol Level    Component Value Date/Time  ETH <10 03/19/2017 1503     SIGNIFICANT DIAGNOSTIC STUDIES  Mr Maxine Glenn Head Wo Contrast 03-20-17 IMPRESSION:  MRI HEAD  1. Large evolving acute ischemic infarct involving essentially the entirety of the left MCA territory. Mass effect with new 5 mm of left-to-right shift. Associated faint petechial hemorrhage without hemorrhagic transformation.  2. Few scattered underlying chronic bilateral cerebellar lacunar infarcts.   MRA HEAD  1. Occluded left ICA and MCA. Right ICA widely patent, with good perfusion of the anterior cerebral arteries via the right A1 segment and anterior communicating artery.  2. Occluded left vertebral artery, with widely patent right vertebral and  basilar arteries.  3. Predominant fetal type origin of the PCAs, with markedly attenuated flow within the left PCA.    Ct Angio Head W Or Wo Contrast Ct Angio Neck W And/or Wo Contrast Ct Cerebral Perfusion W Contrast 19-Mar-2017 IMPRESSION:  1. 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 The left ICA is nearly isolated from the circle-of-Willis given a tiny anterior communicating artery and fetal type left PCA that has superimposed tandem high-grade stenoses. There is underfilling of vessels to the left cerebral hemisphere with ischemia/delayed flow by perfusion. Cerebral perfusion estimates a 10 cc left parietal white matter infarct that is underestimated - there is left parietal cortex and left lateral lenticulostriate infarcts by noncontrast CT.  2. Left proximal subclavian occlusion with less intense/delayed downstream opacification and implied retrograde flow in the left vertebral artery. The left vertebral artery is essentially occluded at the V4 segment and this flow is retrograde via an occipital collateral.  3. High-grade proximal right V1 segment stenosis.  4. Mild fat stranding over the right clavicle, please correlate for clinical findings of contusion or cellulitis.   Cerebral Angiogram March 19, 2017 1.Occluded LT ICA prox and at the terminus with occlusion of the Lt MCA prox. Revascularization Of Lt ICA prox With stent assisted angioplasty and rescue pharmacological thrombolysis of acute platelet aggregation within the stents With 10 mg of IA REOPRO and 8.5mg  of INTEGRELIN.. 2.S/P x passes with solitaire 4mm x 40 mm retrieval device of the occluded Lt MCA with no significant recanalization.  Ct Head Wo Contrast Mar 20, 2017 IMPRESSION:  1. No intracranial hemorrhage or contrast extravasation/staining.  2. Unchanged appearance of focal areas of hypoattenuation in the left occipital lobe and left lenticulostriate, which may indicate a recent infarcts.    Ct Head Wo Contrast 2017/03/19 IMPRESSION:  1. Recent appearing small infarcts in the left parietal cortex (posterior MCA watershed) and left basal ganglia (lateral lenticulostriate). CTA and cerebral perfusion or pending.  2. Small remote left cerebellar infarcts. Presumed chronic small vessel ischemic in the cerebral white matter.   Transthoracic Echocardiogram  05/02/2016 Study Conclusions - Left ventricle: The cavity size was normal. Systolic function was   normal. The estimated ejection fraction was in the range of 60%   to 65%. Wall motion was normal; there were no regional wall   motion abnormalities. Left ventricular diastolic function   parameters were normal. - Atrial septum: No defect or patent foramen ovale was identified.   Bilateral Carotid Dopplers  03-20-2017 Right ICA patent with no significant stenosis. Left ICA stent appears occluded. Right vert is patent with antegrade flow. Left vert is patent with retrograde flow.      HISTORY OF PRESENT ILLNESS Haley Montgomery is an 62 y.o. female who arrived at the ED via EMS with slurred speech, left gaze preference and altered gait. She lives with her granddaughter who  states that the patient's LKN was 9 AM on Tuesday. Later that day, when granddaughter returned from class, she noted the patient to have slurred speech, left gaze and altered gait that afternoon when she came home from class. Subsequently,  on the day of admission at 10 AM, the granddaughter found patient on the ground in her bedroom; granddaughter asked her if she was going to get up and the patient nodded yes. Granddaughter checked on her again that afternoon and patient was unable to move, so EMS was called. Right sided paralysis was noted on EMS arrival, in addition to aphasia and left side gaze deviation.     HOSPITAL COURSE ASSESSMENT/PLAN Ms. Haley Montgomery is a 61 y.o. female with history of hypothyroidism, hyperlipidemia, hypertension, and remote  infarcts by imaging   who presented with right hemi-plegia, slurred speech, and left gaze preference. She did not receive IV t-PA due to late presentation. She was taken to interventional radiology for placement of a left ICA stent but subsequent in-stent stenosis and failed left MCA recannulization.  She was subsequently admitted to the neuro intensive care unit for further monitoring and treatment.  Stroke:  Left MCA infarct due to left ICA and MCA occlusion s/p IR with left ICA stent but subsequent in-stent stenosis and failed left MCA recannulization - etiology suspicious for cardioembolic source given both left ICA and and left subclavian occlusion on CTA with subclavian steal  Resultant  Intubated, right hemiplegia  CT head -  acute left parietal cortex and left BG infarct   MRI head - Large evolving acute ischemic infarct involving essentially the entirety of the left MCA territory.5 mm L -> R shift.  MRA head - Occluded left ICA and MCA. Occluded left vertebral artery.  CTA - Lt ICA occlusion - Lt prox subclavian occlusion with subclavian steal from [place] - Lt V4 Occluded - High-grade prox Rt V1.  Carotid Doppler - Lt ICA stent occluded. Lt vert is patent with retrograde flow.  2D Echo -   EF 60-65%. No cardiac source of emboli identified.  LDL - 131  HgbA1c - 5.4  VTE prophylaxis - heparin subq  Diet NPO time specified  No antithrombotic prior to admission, now on ASA 81mg  and brilinta 90mg  bid s/p ICA stent.  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  CTA showed left subclavian A occlusion with VA retrograde filling  Confirmed on CUS with left VA retrograde flow  Concerning for cardioembolic source   Respiratory failure  Intubated on vent  CCM on board  Extubate as able   Hypertension  Stable              BP goal 120-160              Off cardene now              Long-term BP goal  normotensive  Hyperlipidemia  Home meds: No lipid lowering medications prior to admission  LDL 131, goal < 70  Add Lipitor 80 mg daily   Continue statin at discharge  Other Stroke Risk Factors  Advanced age  Former cigarette smoker - quit 14 years ago  ETOH use, advised to drink no more than 1 drink per day.  Hx stroke/TIA (by imaging)  Other Active Problems  Hypothyroidism  Cerebral edema - placed on 3% saline at 75 cc/hr. Plans are being made for a central line placement.    DISCHARGE  EXAM Blood pressure (!) 159/56, pulse 69, temperature 99.3 F (37.4 C), temperature source Axillary, resp. rate (!) 23, height 5\' 2"  (1.575 m), weight 154 lb 12.2 oz (70.2 kg), SpO2 100 %.  General - Well nourished, well developed, intubated on sedation.  Ophthalmologic - Fundi not visualized due to noncooperation.  Cardiovascular - Regular rate and rhythm.  Neuro -intubated on sedation, eyes closed, briefly open eyes on repetitive stimulation.  Not following commands, not blinking to visual threat with eyelid held open, no tracking.  Eyes medial position, sluggish doll's eyes, positive corneal and gag.  Facial symmetry not able to tested due to ET tube.  Left side UE and LE spontaneous movement and against gravity, RUE flaccid and RLE slight withdrawal on pain summation, however, strength not able to adequately tested due to right femoral sheath in place. Sensation, coordination and gait not tested.   Disposition On 03/02/2017 an MRI/MRA of the head/brain was ordered by Dr. Roda ShuttersXu. The results came back that evening when Dr. Laurence SlateAroor was on call. Dr Aroor reviewed the studies and contacted the patient's power of attorney Dwayne Crawford. The results were discussed as well as the seriousness of the patient's condition. Dr. Laurence SlateAroor felt that neurosurgery would needed to be contacted for a possible craniectomy if aggressive measures were to be pursued. Mr. Okey DupreCrawford requested some time to give  the situation more thought. He did not feel that the patient would want to live with a severe disability. The following morning Mr. Crawford and the patient's granddaughter came to the hospital for further discussions with Dr. Lucia GaskinsAhern and Dr Tyson AliasFeinstein regarding the patient's condition and her overall prognosis. A decision was made not to pursue surgical intervention. The patient previously had a DNR order. The patient's granddaughter and Mr. Okey DupreCrawford requested that the patient be extubated with the end of life sustaining measures protocol. This was performed by the respiratory therapist at 12:42 PM. Asystole was documented at 2:51 PM. The family was at the bedside. Emotional support was provided by the staff.    40 minutes were spent preparing discharge.  Delton Seeavid Laurana Magistro PA-C [facility] Pager [phone] 12-18-16, 3:23 PM

## 2017-03-10 NOTE — Progress Notes (Signed)
Patient with asystole on monitor. Yeni P RN and myself auscultated no breath or cardiac sounds. Family at bedside. Emotional support given.

## 2017-03-10 NOTE — Progress Notes (Signed)
220ML of fluid wasted in the sink of morphine 1mg /ml. 20Ml of fluid wasted in sink of ativan 1mg /ml all wasted with Lindie SpruceMeghan B RN

## 2017-03-10 NOTE — Progress Notes (Signed)
SLP Cancellation Note  Patient Details Name: Katha HammingKaren V Kaczmarek MRN: [id number] DOB: 1955-06-28   Cancelled treatment:       Reason Eval/Treat Not Completed: Medical issues which prohibited therapy. Remains intubated. Will f/u 11/25.   Ferdinand LangoLeah Miya Luviano MA, CCC-SLP [phone]    Ferdinand LangoMcCoy Bernis Schreur Meryl Dec 12, 2016, 7:27 AM

## 2017-03-10 NOTE — Progress Notes (Signed)
OT Cancellation Note  Patient Details Name: Katha HammingKaren V Holwerda MRN: [id number] DOB: [birth year]   Cancelled Treatment:    Reason Eval/Treat Not Completed: Patient not medically ready(Remains intubated and sedated). Will check back as appropriate.   Doristine Sectionharity A Sarah Baez, MS OTR/L  Pager: [phone]   Doristine SectionCharity A Savvas Roper 2016-11-28, 7:37 AM

## 2017-03-10 DEATH — deceased

## 2017-03-13 ENCOUNTER — Telehealth: Payer: Self-pay

## 2017-03-13 NOTE — Telephone Encounter (Signed)
Rn call [facility]. Rn notified them the death certificate is ready for pick up.Death certificate at front desk.

## 2017-03-22 ENCOUNTER — Ambulatory Visit: Payer: BLUE CROSS/BLUE SHIELD | Admitting: Internal Medicine

## 2019-05-08 IMAGING — DX DG CHEST 1V PORT
1 series · 1 of 1 positions shown · non-contrast
Comparison: 03/02/2017

CLINICAL DATA: Endotracheal E intubated.

EXAM:
PORTABLE CHEST 1 VIEW

[chest ap]
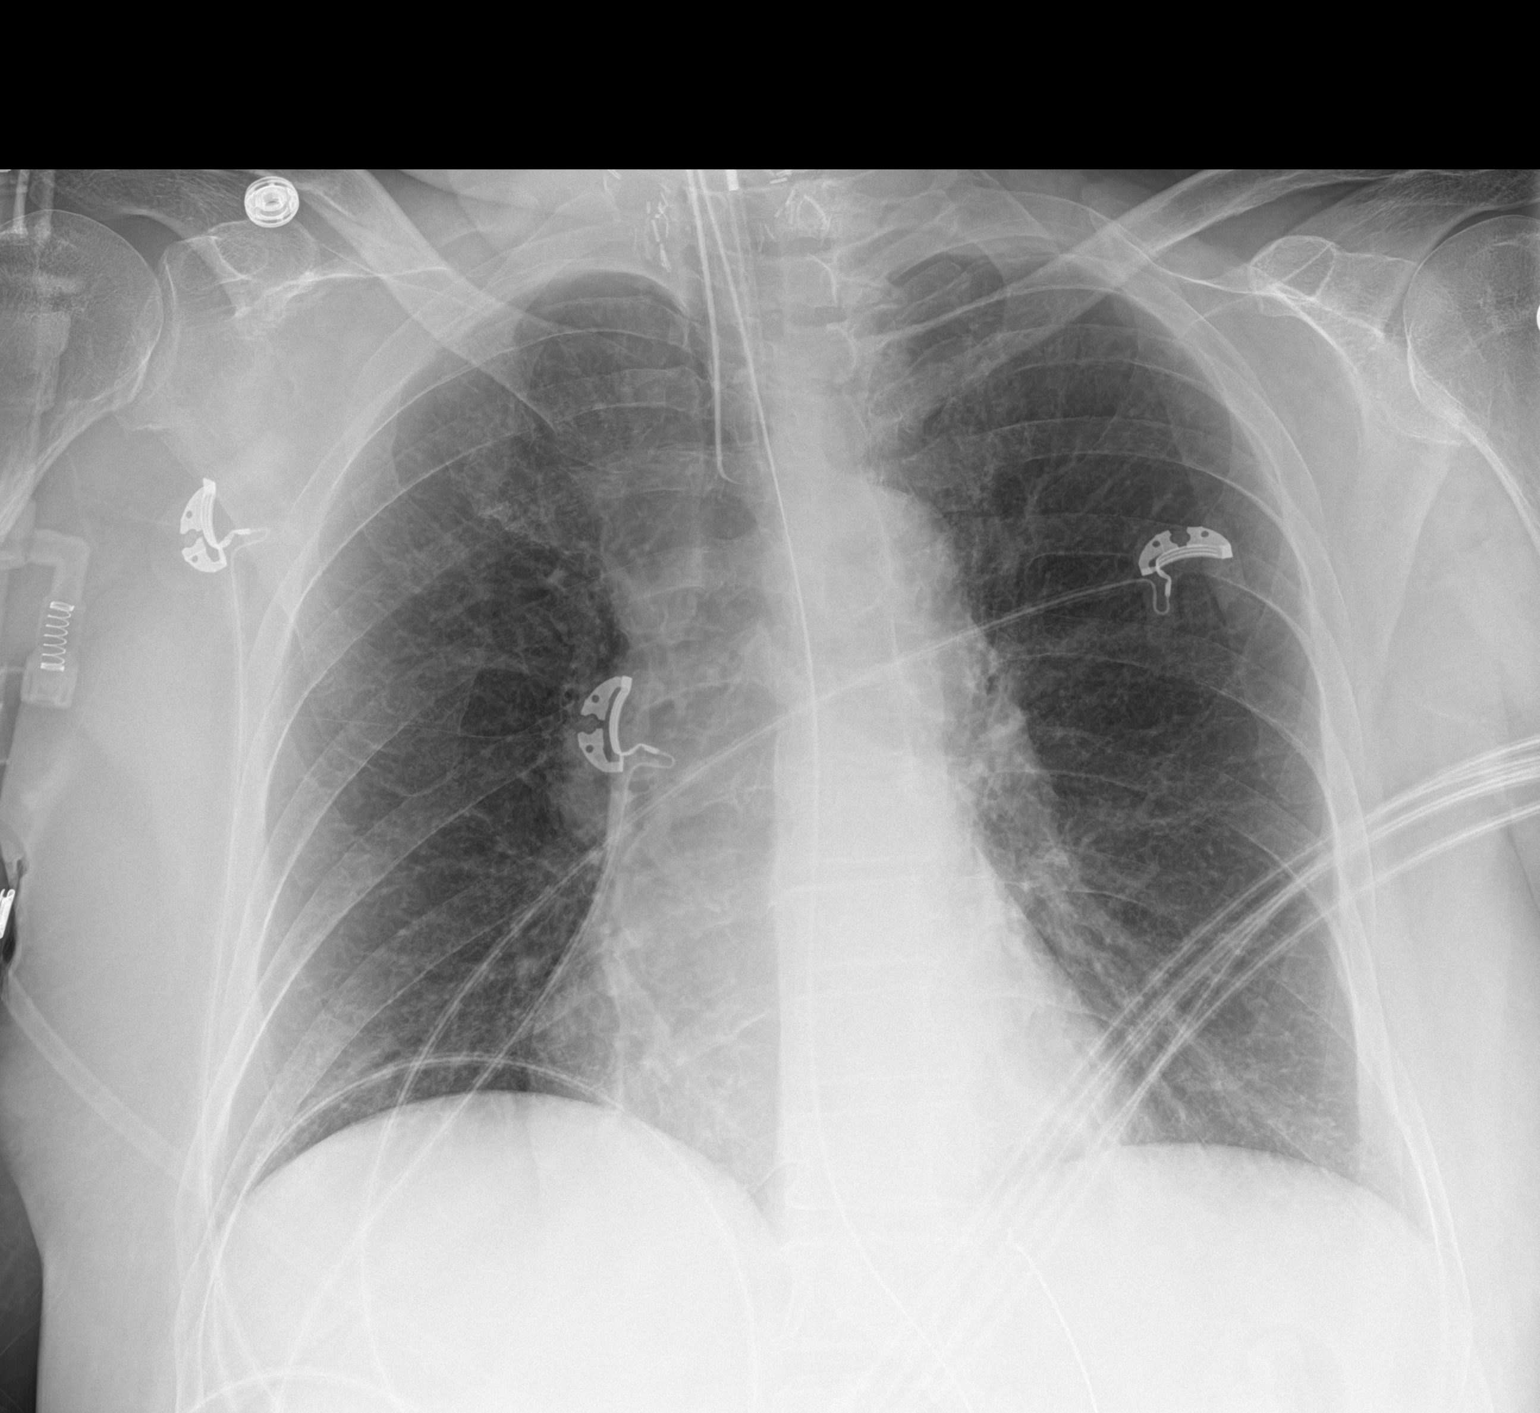

[1 of 1 positions shown; findings below may reference images not displayed]

FINDINGS: Endotracheal tube terminates at the inferior margin of the
clavicular heads, 3.3 cm above the carina. Enteric tube terminates
in the proximal stomach. The cardiomediastinal silhouette is
unchanged. Aortic atherosclerosis is noted. Minimal opacity is
present in the left lung base, likely atelectasis. No edema, sizable
pleural effusion, or pneumothorax is identified. Surgical clips are
present in the lower neck.
IMPRESSION: Support devices as above.  Minimal left basilar atelectasis.

## 2024-01-18 NOTE — Telephone Encounter (Signed)
 error
